# Patient Record
Sex: Male | Born: 1995 | Hispanic: Yes | Smoking: Former smoker
Health system: Southern US, Community
[De-identification: ages and names within clinical notes are randomized; demographics above are authoritative.]

## PROBLEM LIST (undated history)

## (undated) DIAGNOSIS — C6290 Malignant neoplasm of unspecified testis, unspecified whether descended or undescended: Secondary | ICD-10-CM

## (undated) HISTORY — PX: STOMACH SURGERY: SHX791

## (undated) HISTORY — DX: Malignant neoplasm of unspecified testis, unspecified whether descended or undescended: C62.90

---

## 2018-07-09 ENCOUNTER — Other Ambulatory Visit: Payer: Self-pay

## 2018-07-09 ENCOUNTER — Emergency Department: Payer: Self-pay

## 2018-07-09 DIAGNOSIS — N509 Disorder of male genital organs, unspecified: Secondary | ICD-10-CM | POA: Insufficient documentation

## 2018-07-09 NOTE — ED Triage Notes (Addendum)
Pt in with co testicular pain and states "won't go down". Pt states it is testicle not penis.States does have some pain when he moves, denies any dysuria. States hx of the same in the past and got an injection in his buttocks. Pt unable to tell me dx or medication given.

## 2018-07-09 NOTE — ED Notes (Signed)
Left testicle noted to be large and very firm on palpation.

## 2018-07-10 ENCOUNTER — Emergency Department
Admission: EM | Admit: 2018-07-10 | Discharge: 2018-07-10 | Disposition: A | Discharge status: Home / Self Care | Payer: Self-pay | Attending: Emergency Medicine | Admitting: Emergency Medicine

## 2018-07-10 ENCOUNTER — Other Ambulatory Visit: Payer: Self-pay

## 2018-07-10 ENCOUNTER — Ambulatory Visit: Payer: Self-pay | Admitting: Urology

## 2018-07-10 ENCOUNTER — Encounter
Admission: RE | Admit: 2018-07-10 | Discharge: 2018-07-10 | Disposition: A | Discharge status: Home / Self Care | Payer: Self-pay | Source: Ambulatory Visit | Attending: Urology | Admitting: Urology

## 2018-07-10 ENCOUNTER — Ambulatory Visit (INDEPENDENT_AMBULATORY_CARE_PROVIDER_SITE_OTHER): Payer: Self-pay | Admitting: Urology

## 2018-07-10 ENCOUNTER — Encounter: Payer: Self-pay | Admitting: Urology

## 2018-07-10 VITALS — BP 130/87 | HR 81 | Ht 63.0 in | Wt 144.3 lb

## 2018-07-10 DIAGNOSIS — N50819 Testicular pain, unspecified: Secondary | ICD-10-CM

## 2018-07-10 DIAGNOSIS — N5089 Other specified disorders of the male genital organs: Secondary | ICD-10-CM

## 2018-07-10 DIAGNOSIS — F1410 Cocaine abuse, uncomplicated: Secondary | ICD-10-CM

## 2018-07-10 DIAGNOSIS — F1910 Other psychoactive substance abuse, uncomplicated: Secondary | ICD-10-CM

## 2018-07-10 LAB — COMPREHENSIVE METABOLIC PANEL
ALT: 85 U/L — ABNORMAL HIGH (ref 0–44)
AST: 35 U/L (ref 15–41)
Albumin: 4.2 g/dL (ref 3.5–5.0)
Alkaline Phosphatase: 91 U/L (ref 38–126)
Anion gap: 9 (ref 5–15)
BUN: 13 mg/dL (ref 6–20)
CO2: 25 mmol/L (ref 22–32)
Calcium: 9 mg/dL (ref 8.9–10.3)
Chloride: 103 mmol/L (ref 98–111)
Creatinine, Ser: 0.62 mg/dL (ref 0.61–1.24)
GFR calc Af Amer: >60 mL/min (ref >60)
GFR calc non Af Amer: >60 mL/min (ref >60)
Glucose, Bld: 110 mg/dL — ABNORMAL HIGH (ref 70–99)
Potassium: 3.5 mmol/L (ref 3.5–5.1)
Sodium: 137 mmol/L (ref 135–145)
Total Bilirubin: 0.7 mg/dL (ref 0.3–1.2)
Total Protein: 8.1 g/dL (ref 6.5–8.1)

## 2018-07-10 LAB — URINE DRUG SCREEN, QUALITATIVE (ARMC ONLY)
Amphetamines, Ur Screen: NOT DETECTED
Barbiturates, Ur Screen: NOT DETECTED
Benzodiazepine, Ur Scrn: NOT DETECTED
Cannabinoid 50 Ng, Ur ~~LOC~~: NOT DETECTED
Cocaine Metabolite,Ur ~~LOC~~: NOT DETECTED
MDMA (Ecstasy)Ur Screen: NOT DETECTED
Methadone Scn, Ur: NOT DETECTED
OPIATE, UR SCREEN: NOT DETECTED
Phencyclidine (PCP) Ur S: NOT DETECTED
Tricyclic, Ur Screen: NOT DETECTED

## 2018-07-10 LAB — LACTATE DEHYDROGENASE: LDH: 126 U/L (ref 98–192)

## 2018-07-10 LAB — CBC
HCT: 45.8 % (ref 39.0–52.0)
Hemoglobin: 15.4 g/dL (ref 13.0–17.0)
MCH: 29.8 pg (ref 26.0–34.0)
MCHC: 33.6 g/dL (ref 30.0–36.0)
MCV: 88.6 fL (ref 80.0–100.0)
NRBC: 0 % (ref 0.0–0.2)
PLATELETS: 469 10*3/uL — AB (ref 150–400)
RBC: 5.17 MIL/uL (ref 4.22–5.81)
RDW: 12.3 % (ref 11.5–15.5)
WBC: 15 10*3/uL — ABNORMAL HIGH (ref 4.0–10.5)

## 2018-07-10 LAB — URINALYSIS, COMPLETE
Bilirubin, UA: NEGATIVE
Glucose, UA: NEGATIVE
Ketones, UA: NEGATIVE
LEUKOCYTES UA: NEGATIVE
Nitrite, UA: NEGATIVE
Protein, UA: NEGATIVE
RBC, UA: NEGATIVE
Specific Gravity, UA: 1.02 (ref 1.005–1.030)
Urobilinogen, Ur: 0.2 mg/dL (ref 0.2–1.0)
pH, UA: 7 (ref 5.0–7.5)

## 2018-07-10 LAB — MICROSCOPIC EXAMINATION
Bacteria, UA: NONE SEEN
Epithelial Cells (non renal): NONE SEEN /hpf (ref 0–10)
RBC, UA: NONE SEEN /hpf (ref 0–2)
WBC UA: NONE SEEN /HPF (ref 0–5)

## 2018-07-10 LAB — HCG, QUANTITATIVE, PREGNANCY: hCG, Beta Chain, Quant, S: 79 m[IU]/mL — ABNORMAL HIGH (ref <5)

## 2018-07-10 MED ORDER — OXYCODONE-ACETAMINOPHEN 5-325 MG PO TABS
1.0000 | ORAL_TABLET | Freq: Once | ORAL | Status: AC
  Administered 2018-07-10: 1 via ORAL
  Filled 2018-07-10: qty 1

## 2018-07-10 MED ORDER — OXYCODONE-ACETAMINOPHEN 5-325 MG PO TABS: 1.0000 | ORAL_TABLET | ORAL | 0 refills | Status: AC | PRN

## 2018-07-10 NOTE — H&P (View-Only) (Signed)
07/10/2018  10:16 AM   [place] Apr 19, 1996 [id number]  Referring provider: No referring provider defined for this encounter.  Chief Complaint  Patient presents with  . Testicle Pain    ED fu   HPI: Andre Valdez is a 23 y.o.[place] male that presents today as an ED follow-up for testicular pain with interpreter, Ronnald Collum.   Patient presented to the emergency department late last night (07/09/2018) with complaints of bilateral burning testicular pain and progressive left testicular swelling over the past month.   Scrotal US w/doppler with diffusely enlarged and heterogeneous appearance of the left testis suspicious for left testicular mass, surgical consultation recommended. Right testis with normal appearance, small hydrocele.  HCG from the ED was elevated.  WBC count 15.0.  Platelets 469.    He reports that at symptom onset, about a month ago, he noted swelling and persistent testicile burning pain on the left side, He denied any recent testicular injury. He has not been sexually active for the last 2-3 months. Denies frequency, urgency, dysuria, gross hematuria.  Patient denies any allergies, blood disorders, past surgical history and/or medical conditions. Patient is not on any medications and has no personal history of allergies/complications with anesthesia. He reports he has no known familial allergies/complications with anesthesia  He reports that he has not noted any lymphadenitis on his body. Although, he reports left abdominal pain starting yesterday.  No familial testicular cancer history. He has a child in [place].  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    PMH: No past medical history on file.  Surgical History: NONE  Home Medications:  Allergies as of 07/10/2018   No Known Allergies     Medication List       Accurate as of July 10, 2018 10:16 AM. Always use your most recent  med list.        oxyCODONE-acetaminophen 5-325 MG tablet Commonly known as:  PERCOCET Take 1 tablet by mouth every 4 (four) hours as needed.      Allergies: No Known Allergies  Family History: No family history on file.  Social History:  has no history on file for tobacco, alcohol, and drug.  ROS: UROLOGY Frequent Urination?: No Hard to postpone urination?: No Burning/pain with urination?: Yes Get up at night to urinate?: No Leakage of urine?: No Urine stream starts and stops?: No Trouble starting stream?: No Do you have to strain to urinate?: No Blood in urine?: No Urinary tract infection?: No Sexually transmitted disease?: No Injury to kidneys or bladder?: No Painful intercourse?: No Weak stream?: No Erection problems?: No Penile pain?: No  Gastrointestinal Nausea?: No Vomiting?: No Indigestion/heartburn?: No Diarrhea?: No Constipation?: No  Constitutional Fever: No Night sweats?: No Weight loss?: No Fatigue?: No  Skin Skin rash/lesions?: No Itching?: No  Eyes Blurred vision?: No Double vision?: No  Ears/Nose/Throat Sore throat?: No Sinus problems?: No  Hematologic/Lymphatic Swollen glands?: No Easy bruising?: No  Cardiovascular Leg swelling?: No Chest pain?: No  Respiratory Cough?: No Shortness of breath?: No  Endocrine Excessive thirst?: No  Musculoskeletal Back pain?: No Joint pain?: No  Neurological Headaches?: No Dizziness?: No  Psychologic Depression?: No Anxiety?: No  Physical Exam: BP 130/87 (BP Location: Left Arm, Patient Position: Sitting)   Pulse 81   Ht 5\' 3"  (1.6 m) Comment: per pt  Wt 144 lb 4.8 oz (65.5 kg)   BMI 25.56 kg/m   Constitutional:  Alert  and oriented, No acute distress. Respiratory: Normal respiratory effort, no increased work of breathing. Head: Normocephalic and Atraumatic. HEENT: Fertile AT, moist mucus membranes.  Trachea midline, no masses. Cardiovascular: No clubbing, cyanosis, or  edema. GI: Abdomen is soft, non tender, non distended, no abdominal masses. Liver and spleen not palpable.  No hernias appreciated.  Stool sample for occult testing is not indicated.   GU: No CVA tenderness. No bladder fullness or masses.  Patient with uncircumcised phallus. Foreskin easily retracted.  Urethral meatus is patent.  No penile discharge. No penile lesions or rashes. Testicles are located scrotally bilaterally. Right scrotum is normal. Firm Left Scrotum with mass, no erythema, lesions, cysts, rashes and/or edema.   Lymph nodes: No cervical, supraclavicular, axillary nodes normal or inguinal lymphadenopathy. Skin: No rashes, bruises or suspicious lesions. Neurologic: Grossly intact, no focal deficits, moving all 4 extremities. Psychiatric: Normal mood and affect.  Laboratory Data: Lab Results  Component Value Date   WBC 15.0 (H) 07/10/2018   HGB 15.4 07/10/2018   HCT 45.8 07/10/2018   MCV 88.6 07/10/2018   PLT 469 (H) 07/10/2018   Urinalysis Microscopic Exam  Ref Range and Units  WBC None 0-5/hpf  RBC None 0-2/hpf  Epithelial Cells None 0-10/hpf (non-renal)  Renal Epithelial Cells None /hpf  Casts None None seen/lpf  Cast Type None   Crystals Presp None seen/lpf  Crystal Type amorphous sediment   Mucus Threads None Not established/lp  Bacteria None None to few seen  Yeast None None seen  Trichomonas None None seen  Dipstick Results    NIT Negative Negative  LEU Negative Negative    Pertinent Imaging: US Scrotum w/ doppler  Result Date: 07/10/2018  CLINICAL DATA:  Bilateral testicular pain for 1 month. Left testicular swelling for 1 week.   EXAM: SCROTAL ULTRASOUND DOPPLER ULTRASOUND OF THE TESTICLES   TECHNIQUE: Complete ultrasound examination of the testicles, epididymis, and other scrotal structures was performed. Color and spectral Doppler ultrasound were also utilized to evaluate blood flow to the testicles.   COMPARISON:  None.   FINDINGS:   Right  testicle Measurements: 4.4 x 2.1 x 2.8 cm. No mass or microlithiasis visualized.   Left testicle Measurements: 8.7 x 5.7 x 7.1 cm. The left testis is diffusely enlarged with heterogeneous appearance. Increased flow throughout the left testis on color flow Doppler imaging. Changes are suspicious for left testicular mass. Testicular rupture with hematoma and infection would be a less likely consideration.   Right epididymis:  Normal in size and appearance.   Left epididymis:  Not visualized.   Hydrocele:  A small right hydrocele is present. Varicocele:  None visualized. Pulsed Doppler interrogation of both testes demonstrates normal low resistance arterial and venous waveforms bilaterally. Abnormal vascularity is demonstrated within the left testis.   IMPRESSION:  1. Diffusely enlarged and heterogeneous appearance of the left testis suspicious for left testicular mass. Surgical consultation is recommended.  2. Right testis has normal appearance.  Small right hydrocele.   Electronically Signed    By: Lucienne Capers M.D.    On: 07/10/2018 01:03   I have personally reviewed all the images today with the patient and interpreter and appreciate the left testicular mass.    Assessment & Plan:    1. Testicular Mass  - I reviewed the Scrotal US images with the patient and translator and addressed all patient questions and concerns.  - We discussed the patients prognosis given imaging and symptom presentation as well as reasonable next steps  -  I explained my recommendation for radical left orchiectomy as well as the surgical process, expectations and possible e outcomes. I explained that following surgery, we will obtain pathology of the testicular mass and pending the pathology, he may require an extended lymphadenectomy, chemotherapy and/or radiation.   - I explained that surgical complications could include infection at the surgical site, scrotal hematoma, and discharge at the surgical site.  - I  further explained that most testicular cancers have favorable results.   - Patient has a child in [place]. We discussed possible fertility complications and discussed preventative measures that can be taken to ensure his family planning options remain unchanged by his surgery, I.e. semen storage. Although, it is less likely he will experience inconveniences given he will retain his right testicle.  - Patient denies any allergies, medication usage, familial or personal anesthetic problems, blood disorders, past surgical history and/or medical conditions.  - I explained the option for a testicular prosthesis following his orchiectomy, as well as possible complications and side effects. I expressed that in my experience, men with testicular prosthesis have experienced prolonged testicular pain/discomfort.  - Patient presented concerns for when he will be able to return to work following his surgery. I advised that he should plan to be out of work for at least 2 weeks  - We discussed that following the surgery it is likely that he will experience pain, bruising, hematoma, mood changes, fatigue and hot flashes.  - Patient is agreeable to plan and would like to move forward with scheduling his surgery.  - CMET  -CT and chest x-ray ordered   2. Left Abdominal Pain  - Likely referred pain from scrotal imbalance due to enlargement in left testicle.  3. History of drug use  - drug screen   Return for radical left orchiectomy.  Zara Council, PA-C [facility] [address] [place], [place] [address] [phone]  30-minute discussion with patient regarding abnormal imaging/lab results, diagnosis possibilities, treatment options, risks and benefits. The patient had few questions and concerns regarding these options and the long-term effects.  I, Temidayo Atanda-Ogunleye , am acting as a scribe for [facility], PA-C  I have reviewed the above documentation  for accuracy and completeness, and I agree with the above.    Zara Council, PA-C

## 2018-07-10 NOTE — Patient Instructions (Signed)
Your procedure is scheduled on: 07/14/17 Mon Su procedimiento est programado para: Report to 2nd floor [place]: To find out your arrival time please call [phone] between 1PM - 3PM onFri 07/11/18 Para saber su hora de llegada por favor llame al ([place]:   Remember: Instructions that are not followed completely may result in serious medical risk, up to and including death,  or upon the discretion of your surgeon and anesthesiologist your surgery may need to be rescheduled.  Recuerde: Las instrucciones que no se siguen completamente Heritage manager en un riesgo de salud grave, incluyendo hasta  la [place] o a discrecin de su cirujano y Environmental health practitioner, su ciruga se puede posponer.   __X_ 1.Do not eat food after midnight the night before your procedure. No    gum chewing or hard candies. You may drink clear liquids up to 2 hours     before you are scheduled to arrive for your surgery- DO not drink clear     Liquids within 2 hours of the start of your surgery.     Clear Liquids include:    water, apple juice without pulp, clear carbohydrate drink such as    Clearfast of Gartorade, Black Coffee or Tea (Do not add anything to coffee or tea).      No coma nada despus de la medianoche de la noche anterior a su    procedimiento. No coma chicles ni caramelos duros. Puede tomar    lquidos claros hasta 2 horas antes de su hora programada de llegada al     hospital para su procedimiento. No tome lquidos claros durante el     transcurso de las 2 horas de su llegada programada al hospital para su     procedimiento, ya que esto puede llevar a que su procedimiento se    retrase o tenga que volver a Health and safety inspector.  Los lquidos claros incluyen:          - Agua o jugo de [place] sin pulpa          - Bebidas claras con carbohidratos como ClearFast o Gatorade          - Caf negro o t claro (sin leche, sin cremas, no agregue nada al caf ni al  t)  No tome nada que no est en esta lista.  Los pacientes con diabetes tipo 1 y tipo 2 solo deben Agricultural engineer.  Llame a la clnica de [facility] o a la unidad de Same Day Surgery si  tiene alguna pregunta sobre estas instrucciones.              _X__ 2.Do Not Smoke or use e-cigarettes For 24 Hours Prior to Your Surgery.    Do not use any chewable tobacco products for at least 6   hours prior to surgery.    No fume ni use cigarrillos electrnicos durante las 24 horas previas    a su [place].  No use ningn producto de tabaco masticable durante   al menos 6 horas antes de la [place].     __X_ 3. No alcohol for 24 hours before or after surgery.    No tome alcohol durante las 24 horas antes ni despus de la [place].   ____4. Bring all medications with you on the day of surgery if instructed.    Lleve todos los medicamentos con usted el da de su ciruga si se le    ha indicado as.  ____ 5. Notify your doctor if there is any change in your medical condition (cold,fever, infections).    Informe a su mdico si hay algn cambio en su condicin mdica  (resfriado, fiebre, infecciones).   Do not wear jewelry, make-up, hairpins, clips or nail polish.  No use joyas, maquillajes, pinzas/ganchos para el cabello ni esmalte de uas.  Do not wear lotions, powders, or perfumes. You may wear deodorant.  No use lociones, polvos o perfumes.  Puede usar desodorante.    Do not shave 48 hours prior to surgery. Men may shave face and neck.  No se afeite 48 horas antes de la [place].  Los hombres pueden [employer] cara  y el cuello.   Do not bring valuables to the hospital.   No lleve objetos [place] is not responsible for any belongings or valuables.  [place] no se hace responsable de ningn tipo de pertenencias u objetos de Geographical information systems officer.               Contacts, dentures or bridgework may not be worn into surgery.  Los lentes de [place], las dentaduras postizas o puentes no se  pueden usar en la [place].   Leave your suitcase in the car. After surgery it may be brought to your room.  Deje su maleta en el auto.  Despus de la ciruga podr traerla a su habitacin.   For patients admitted to the hospital, discharge time is determined by your  treatment team.  Para los pacientes que sean ingresados al hospital, el tiempo en el cual se le  dar de alta es determinado por su equipo de [place].   Patients discharged the day of surgery will not be allowed to drive home. A los pacientes que se les da de alta el mismo da de la ciruga no se les permitir conducir a Holiday representative.   Please read over the following fact sheets that you were given: Por favor [place] informacin que le dieron:    ____ Take these medicines the morning of surgery with A SIP OF WATER:          [employer] medicinas la maana de la ciruga con UN SORBO DE AGUA:  1.None  2.   3.   4.       5.  6.  ____ Fleet Enema (as directed)          Enema de Fleet (segn lo indicado)    __x__ Use CHG Soap as directed          Utilice el jabn de CHG segn lo indicado  ____ Use inhalers on the day of surgery          Use los inhaladores el da de la ciruga  ____ Stop metformin 2 days prior to surgery          Deje de tomar el metformin 2 das antes de la ciruga    ____ Take 1/2 of usual insulin dose the night before surgery and none on the morning of surgery           Tome la mitad de la dosis habitual de insulina la noche antes de la [place] y no tome nada en la maana de la             ciruga  ____ Stop Coumadin/Plavix/aspirin on           Deje de tomar el Coumadin/Plavix/aspirina el da:  ____ Stop Anti-inflammatories  on           Deje de tomar [employer]:   ____ Stop supplements until after surgery            Deje de tomar suplementos hasta despus de la ciruga  ____ Bring C-Pap to the hospital          [address] al hospital

## 2018-07-10 NOTE — ED Notes (Addendum)
ED Provider at bedside. 

## 2018-07-10 NOTE — Patient Instructions (Signed)
Orquiectoma Orchiectomy  La orquiectoma es la extirpacin de uno o ambos testculos. Generalmente, se realiza para tratar el cncer de prstata o de testculos. Se puede realizar en hombres con cncer de mama o para prevenir el cncer en hombres cuyos testculos no se desarrollaron normalmente. Una orquiectoma tambin puede ser necesaria cuando no se puede reparar una lesin en un testculo. Los testculos pueden reemplazarse con testculos artificiales (prtesis). Informe al mdico acerca de lo siguiente:  Cualquier alergia que tenga.  Todos los [employer], incluidos vitaminas, hierbas, gotas oftlmicas, cremas y medicamentos de venta libre.  Cualquier problema que usted o sus familiares hayan tenido con anestsicos.  Cualquier enfermedad de la sangre que tenga.  Cirugas a las que se someti.  Cualquier enfermedad que tenga. Cules son los riesgos?  Infeccin en el lugar de la [place].  Hemorragia en el interior del saco que contiene los testculos (escroto). Esto se llama hematoma escrotal.  Secrecin en el lugar de la ciruga. Qu ocurre antes del procedimiento? Hidratacin Siga las indicaciones del mdico acerca de la hidratacin, las cuales pueden incluir lo siguiente:  Hasta 2 horas antes del procedimiento, puede beber lquidos transparentes, como agua, jugos frutales transparentes, caf negro y t solo. Restricciones en las comidas y bebidas Siga las indicaciones del mdico respecto de las comidas y bebidas, las cuales pueden incluir lo siguiente:  Ocho horas antes del procedimiento, deje de ingerir comidas o alimentos pesados, por ejemplo, carne, alimentos fritos o alimentos grasos.  Seis horas antes del procedimiento, deje de ingerir comidas o alimentos livianos, como tostadas o cereales.  Seis horas antes del procedimiento, deje de beber [place] o bebidas que [employer].  Dos horas antes del procedimiento, deje de beber lquidos  transparentes. Instrucciones generales  Consulte al mdico si debe hacer o no lo siguiente: ? Cambiar o suspender los medicamentos que toma habitualmente. Esto es muy importante si toma medicamentos para la diabetes o anticoagulantes. ? Tomar medicamentos, como aspirina e ibuprofeno. Estos medicamentos pueden tener un efecto anticoagulante en la [place]. No tome estos medicamentos antes del procedimiento si el mdico le indica que no lo haga.  Pueden recetarle un antibitico por va oral. Consulte al [employer] sobre tomar [employer] con un sorbo de agua la maana del procedimiento.  Le indicarn que se higienice la zona genital con un jabn antisptico la maana del procedimiento.  Pdale a alguien que lo lleve a su casa desde el hospital o la clnica. Qu ocurre durante el procedimiento?  Para disminuir el riesgo de contraer una infeccin: ? El equipo mdico se lavar o se Transport planner. ? Le lavarn la piel con jabn. ? Pueden rasurarle la zona [place].  Le colocarn un tubo (catter) intravenoso en una de las venas. Le administrarn uno o ms de los siguientes medicamentos: ? Un medicamento para ayudarlo a relajarse (sedante). ? Un medicamento para adormecer la zona (anestesia local). ? Un medicamento que lo har dormir (anestesia general).  Este procedimiento puede implicar la extraccin de uno o ambos testculos. Los pasos para el procedimiento dependern del motivo del procedimiento. ? Si su procedimiento es para el tratamiento del cncer de prstata:  Le harn un corte (incisin) en el escroto.  El testculo se extirpar a travs de la incisin hecha en el escroto.  Se puede insertar una prtesis llena de solucin salina para llenar el espacio en el escroto donde se extirp el testculo. ? Si su procedimiento es para el tratamiento del cncer de testculo:  Le harn un corte (incisin) en la ingle.  El testculo y el cordn espermtico se extirparn a travs  de la incisin hecha en la ingle.  Se puede insertar una prtesis llena de solucin salina para llenar el espacio en el escroto donde se extirp el testculo. ? Luego, la incisin se cerrar con puntos (suturas), tiras Raelyn Number o goma para cerrar la piel. ? Se le colocar una venda (vendaje) estril en el lugar de la incisin. Este procedimiento puede variar segn el mdico y el hospital. Sander Nephew ocurre despus del procedimiento?  Le controlarn la presin arterial, la frecuencia cardaca, la frecuencia respiratoria y Retail buyer de oxgeno en la sangre hasta que haya desaparecido el efecto de los medicamentos administrados.  Una vez que despierte, se encuentre estabilizado y pueda ingerir lquidos correctamente, si no ocurre un imprevisto, podr volver a su casa.  Puede tener que usar un soporte escrotal. Si el soporte escrotal Dance movement psychotherapist de la incisin, podr Nurse, learning disability.  Tendr un vendaje estril. Puede sacarse el vendaje, especialmente por la noche. El aire ayuda a que se forme una costra, lo que eliminar la necesidad de Psychologist, forensic.  No conduzca durante 24 horas si le administraron un sedante. Resumen  La orquiectoma es un procedimiento quirrgico que se realiza para extirpar uno o ambos testculos.  La orquiectoma generalmente se realiza para tratar el cncer de prstata o de testculos.  Se puede insertar una prtesis llena de solucin salina para llenar el espacio en el escroto donde se extirp el testculo. Esta informacin no tiene Marine scientist el consejo del mdico. Asegrese de hacerle al mdico cualquier pregunta que tenga. Document Released: 01/21/2013 Document Revised: 02/26/2017 Document Reviewed: 02/26/2017 Elsevier Interactive Patient Education  2019 [employer].

## 2018-07-10 NOTE — ED Provider Notes (Signed)
[facility] Emergency Department Provider Note __________________   First MD Initiated Contact with Patient 07/10/18 0103     (approximate)  I have reviewed the triage vital signs and the nursing notes.   HISTORY  Chief Complaint Testicle Pain    HPI Andre Valdez is a 23 y.o. male Libby Maw to the emergency department with a 1 month history of progressive swelling and pain to the left testicle.  Patient denies any dysuria.  Patient denies any hematuria no nausea vomiting diarrhea constipation.  Patient denies any weight loss.  Past medical history None There are no active problems to display for this patient.    Prior to Admission medications   Not on File    Allergies No known drug allergies with No family history on file.  Social History Social History   Tobacco Use  . Smoking status: Not on file  Substance Use Topics  . Alcohol use: Not on file  . Drug use: Not on file    Review of Systems Constitutional: No fever/chills Eyes: No visual changes. ENT: No sore throat. Cardiovascular: Denies chest pain. Respiratory: Denies shortness of breath. Gastrointestinal: No abdominal pain.  No nausea, no vomiting.  No diarrhea.  No constipation. Genitourinary: Negative for dysuria.  Positive for left testicular pain and swelling Musculoskeletal: Negative for neck pain.  Negative for back pain. Integumentary: Negative for rash. Neurological: Negative for headaches, focal weakness or numbness.   ____________________________________________   PHYSICAL EXAM:  VITAL SIGNS: ED Triage Vitals  Enc Vitals Group     BP 07/09/18 2316 (!) 141/77     Pulse Rate 07/09/18 2316 85     Resp 07/09/18 2316 16     Temp 07/09/18 2316 99.3 F (37.4 C)     Temp Source 07/09/18 2316 Oral     SpO2 07/09/18 2316 99 %     Weight --      Height --      Head Circumference --      Peak Flow --      Pain Score 07/09/18 2320 8     Pain Loc --    Pain Edu? --      Excl. in [place]? --     Constitutional: Alert and oriented. Well appearing and in no acute distress. Eyes: Conjunctivae are normal.  Mouth/Throat: Mucous membranes are moist.  Oropharynx non-erythematous. Neck: No stridor.   Cardiovascular: Normal rate, regular rhythm. Good peripheral circulation. Grossly normal heart sounds. Respiratory: Normal respiratory effort.  No retractions. Lungs CTAB. Gastrointestinal: Soft and nontender. No distention.  Genitourinary: Markedly swollen and indurated left testicle very tender to touch. Musculoskeletal: No lower extremity tenderness nor edema. No gross deformities of extremities. Neurologic:  Normal speech and language. No gross focal neurologic deficits are appreciated.  Skin:  Skin is warm, dry and intact. No rash noted.   ____________________________________________   LABS (all labs ordered are listed, but only abnormal results are displayed)  Labs Reviewed  CBC - Abnormal; Notable for the following components:      Result Value   WBC 15.0 (*)    Platelets 469 (*)    All other components within normal limits  LACTATE DEHYDROGENASE  AFP TUMOR MARKER  HCG, QUANTITATIVE, PREGNANCY    RADIOLOGY I,  N Brantlee Hinde, personally viewed and evaluated these images (plain radiographs) as part of my medical decision making, as well as reviewing the written report by the radiologist.  ED MD interpretation: Diffusely enlarged and heterogeneous appearance of the  left testis suspicious for left testicular mass per radiologist.  Official radiology report(s): US Scrotum W/doppler  Result Date: 07/10/2018 CLINICAL DATA:  Bilateral testicular pain for 1 month. Left testicular swelling for 1 week. EXAM: SCROTAL ULTRASOUND DOPPLER ULTRASOUND OF THE TESTICLES TECHNIQUE: Complete ultrasound examination of the testicles, epididymis, and other scrotal structures was performed. Color and spectral Doppler ultrasound were also utilized to evaluate  blood flow to the testicles. COMPARISON:  None. FINDINGS: Right testicle Measurements: 4.4 x 2.1 x 2.8 cm. No mass or microlithiasis visualized. Left testicle Measurements: 8.7 x 5.7 x 7.1 cm. The left testis is diffusely enlarged with heterogeneous appearance. Increased flow throughout the left testis on color flow Doppler imaging. Changes are suspicious for left testicular mass. Testicular rupture with hematoma and infection would be a less likely consideration. Right epididymis:  Normal in size and appearance. Left epididymis:  Not visualized. Hydrocele:  A small right hydrocele is present. Varicocele:  None visualized. Pulsed Doppler interrogation of both testes demonstrates normal low resistance arterial and venous waveforms bilaterally. Abnormal vascularity is demonstrated within the left testis. IMPRESSION: 1. Diffusely enlarged and heterogeneous appearance of the left testis suspicious for left testicular mass. Surgical consultation is recommended. 2. Right testis has normal appearance.  Small right hydrocele. Electronically Signed   By: Lucienne Capers M.D.   On: 07/10/2018 01:03    Procedures   ____________________________________________   INITIAL IMPRESSION / ASSESSMENT AND PLAN / ED COURSE  As part of my medical decision making, I reviewed the following data within the electronic MEDICAL RECORD NUMBER   23 year old male presenting with above-stated history and physical exam concerning for testicular cancer versus epididymitis or orchitis.  Ultrasound performed consistent with left testicular mass.  Patient discussed with Dr. Erlene Quan urology appropriate tumor markers obtained.  Patient will follow-up with Dr. Erlene Quan for outpatient evaluation and management. ____________________________________________  FINAL CLINICAL IMPRESSION(S) / ED DIAGNOSES  Final diagnoses:  Testicular mass     MEDICATIONS GIVEN DURING THIS VISIT:  Medications  oxyCODONE-acetaminophen (PERCOCET/ROXICET) 5-325  MG per tablet 1 tablet (1 tablet Oral Given 07/10/18 0136)     ED Discharge Orders    None       Note:  This document was prepared using Dragon voice recognition software and may include unintentional dictation errors.   Gregor Hams, MD 07/10/18 [phone]

## 2018-07-10 NOTE — Progress Notes (Addendum)
07/10/2018  10:16 AM   [place] 1995-09-01 [id number]  Referring provider: No referring provider defined for this encounter.  Chief Complaint  Patient presents with  . Testicle Pain    ED fu   HPI: Andre Valdez is a 23 y.o.[place] male that presents today as an ED follow-up for testicular pain with interpreter, Ronnald Collum.   Patient presented to the emergency department late last night (07/09/2018) with complaints of bilateral burning testicular pain and progressive left testicular swelling over the past month.   Scrotal US w/doppler with diffusely enlarged and heterogeneous appearance of the left testis suspicious for left testicular mass, surgical consultation recommended. Right testis with normal appearance, small hydrocele.  HCG from the ED was elevated.  WBC count 15.0.  Platelets 469.    He reports that at symptom onset, about a month ago, he noted swelling and persistent testicile burning pain on the left side, He denied any recent testicular injury. He has not been sexually active for the last 2-3 months. Denies frequency, urgency, dysuria, gross hematuria.  Patient denies any allergies, blood disorders, past surgical history and/or medical conditions. Patient is not on any medications and has no personal history of allergies/complications with anesthesia. He reports he has no known familial allergies/complications with anesthesia  He reports that he has not noted any lymphadenitis on his body. Although, he reports left abdominal pain starting yesterday.  No familial testicular cancer history. He has a child in [place].  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    PMH: No past medical history on file.  Surgical History: NONE  Home Medications:  Allergies as of 07/10/2018   No Known Allergies     Medication List       Accurate as of July 10, 2018 10:16 AM. Always use your most recent  med list.        oxyCODONE-acetaminophen 5-325 MG tablet Commonly known as:  PERCOCET Take 1 tablet by mouth every 4 (four) hours as needed.      Allergies: No Known Allergies  Family History: No family history on file.  Social History:  has no history on file for tobacco, alcohol, and drug.  ROS: UROLOGY Frequent Urination?: No Hard to postpone urination?: No Burning/pain with urination?: Yes Get up at night to urinate?: No Leakage of urine?: No Urine stream starts and stops?: No Trouble starting stream?: No Do you have to strain to urinate?: No Blood in urine?: No Urinary tract infection?: No Sexually transmitted disease?: No Injury to kidneys or bladder?: No Painful intercourse?: No Weak stream?: No Erection problems?: No Penile pain?: No  Gastrointestinal Nausea?: No Vomiting?: No Indigestion/heartburn?: No Diarrhea?: No Constipation?: No  Constitutional Fever: No Night sweats?: No Weight loss?: No Fatigue?: No  Skin Skin rash/lesions?: No Itching?: No  Eyes Blurred vision?: No Double vision?: No  Ears/Nose/Throat Sore throat?: No Sinus problems?: No  Hematologic/Lymphatic Swollen glands?: No Easy bruising?: No  Cardiovascular Leg swelling?: No Chest pain?: No  Respiratory Cough?: No Shortness of breath?: No  Endocrine Excessive thirst?: No  Musculoskeletal Back pain?: No Joint pain?: No  Neurological Headaches?: No Dizziness?: No  Psychologic Depression?: No Anxiety?: No  Physical Exam: BP 130/87 (BP Location: Left Arm, Patient Position: Sitting)   Pulse 81   Ht 5\' 3"  (1.6 m) Comment: per pt  Wt 144 lb 4.8 oz (65.5 kg)   BMI 25.56 kg/m   Constitutional:  Alert  and oriented, No acute distress. Respiratory: Normal respiratory effort, no increased work of breathing. Head: Normocephalic and Atraumatic. HEENT: [place] AT, moist mucus membranes.  Trachea midline, no masses. Cardiovascular: No clubbing, cyanosis, or  edema. GI: Abdomen is soft, non tender, non distended, no abdominal masses. Liver and spleen not palpable.  No hernias appreciated.  Stool sample for occult testing is not indicated.   GU: No CVA tenderness. No bladder fullness or masses.  Patient with uncircumcised phallus. Foreskin easily retracted.  Urethral meatus is patent.  No penile discharge. No penile lesions or rashes. Testicles are located scrotally bilaterally. Right scrotum is normal. Firm Left Scrotum with mass, no erythema, lesions, cysts, rashes and/or edema.   Lymph nodes: No cervical, supraclavicular, axillary nodes normal or inguinal lymphadenopathy. Skin: No rashes, bruises or suspicious lesions. Neurologic: Grossly intact, no focal deficits, moving all 4 extremities. Psychiatric: Normal mood and affect.  Laboratory Data: Lab Results  Component Value Date   WBC 15.0 (H) 07/10/2018   HGB 15.4 07/10/2018   HCT 45.8 07/10/2018   MCV 88.6 07/10/2018   PLT 469 (H) 07/10/2018   Urinalysis Microscopic Exam  Ref Range and Units  WBC None 0-5/hpf  RBC None 0-2/hpf  Epithelial Cells None 0-10/hpf (non-renal)  Renal Epithelial Cells None /hpf  Casts None None seen/lpf  Cast Type None   Crystals Presp None seen/lpf  Crystal Type amorphous sediment   Mucus Threads None Not established/lp  Bacteria None None to few seen  Yeast None None seen  Trichomonas None None seen  Dipstick Results    NIT Negative Negative  LEU Negative Negative    Pertinent Imaging: US Scrotum w/ doppler  Result Date: 07/10/2018  CLINICAL DATA:  Bilateral testicular pain for 1 month. Left testicular swelling for 1 week.   EXAM: SCROTAL ULTRASOUND DOPPLER ULTRASOUND OF THE TESTICLES   TECHNIQUE: Complete ultrasound examination of the testicles, epididymis, and other scrotal structures was performed. Color and spectral Doppler ultrasound were also utilized to evaluate blood flow to the testicles.   COMPARISON:  None.   FINDINGS:   Right  testicle Measurements: 4.4 x 2.1 x 2.8 cm. No mass or microlithiasis visualized.   Left testicle Measurements: 8.7 x 5.7 x 7.1 cm. The left testis is diffusely enlarged with heterogeneous appearance. Increased flow throughout the left testis on color flow Doppler imaging. Changes are suspicious for left testicular mass. Testicular rupture with hematoma and infection would be a less likely consideration.   Right epididymis:  Normal in size and appearance.   Left epididymis:  Not visualized.   Hydrocele:  A small right hydrocele is present. Varicocele:  None visualized. Pulsed Doppler interrogation of both testes demonstrates normal low resistance arterial and venous waveforms bilaterally. Abnormal vascularity is demonstrated within the left testis.   IMPRESSION:  1. Diffusely enlarged and heterogeneous appearance of the left testis suspicious for left testicular mass. Surgical consultation is recommended.  2. Right testis has normal appearance.  Small right hydrocele.   Electronically Signed    By: Lucienne Capers M.D.    On: 07/10/2018 01:03   I have personally reviewed all the images today with the patient and interpreter and appreciate the left testicular mass.    Assessment & Plan:    1. Testicular Mass  - I reviewed the Scrotal US images with the patient and translator and addressed all patient questions and concerns.  - We discussed the patients prognosis given imaging and symptom presentation as well as reasonable next steps  -  I explained my recommendation for radical left orchiectomy as well as the surgical process, expectations and possible e outcomes. I explained that following surgery, we will obtain pathology of the testicular mass and pending the pathology, he may require an extended lymphadenectomy, chemotherapy and/or radiation.   - I explained that surgical complications could include infection at the surgical site, scrotal hematoma, and discharge at the surgical site.  - I  further explained that most testicular cancers have favorable results.   - Patient has a child in [place]. We discussed possible fertility complications and discussed preventative measures that can be taken to ensure his family planning options remain unchanged by his surgery, I.e. semen storage. Although, it is less likely he will experience inconveniences given he will retain his right testicle.  - Patient denies any allergies, medication usage, familial or personal anesthetic problems, blood disorders, past surgical history and/or medical conditions.  - I explained the option for a testicular prosthesis following his orchiectomy, as well as possible complications and side effects. I expressed that in my experience, men with testicular prosthesis have experienced prolonged testicular pain/discomfort.  - Patient presented concerns for when he will be able to return to work following his surgery. I advised that he should plan to be out of work for at least 2 weeks  - We discussed that following the surgery it is likely that he will experience pain, bruising, hematoma, mood changes, fatigue and hot flashes.  - Patient is agreeable to plan and would like to move forward with scheduling his surgery.  - CMET  -CT and chest x-ray ordered   2. Left Abdominal Pain  - Likely referred pain from scrotal imbalance due to enlargement in left testicle.  3. History of drug use  - drug screen   Return for radical left orchiectomy.  Zara Council, PA-C [facility] [address], [place], [place] [address] [phone]  30-minute discussion with patient regarding abnormal imaging/lab results, diagnosis possibilities, treatment options, risks and benefits. The patient had few questions and concerns regarding these options and the long-term effects.  I, Temidayo Atanda-Ogunleye , am acting as a scribe for [facility], PA-C  I have reviewed the above documentation  for accuracy and completeness, and I agree with the above.    Zara Council, PA-C

## 2018-07-10 NOTE — Pre-Procedure Instructions (Signed)
Hands and feet reddish coloration, skin cool, radial pulses moderate, capillary refill > 3 sec., pedal pulses weak. Nail beds blanched.

## 2018-07-10 NOTE — Pre-Procedure Instructions (Signed)
EKG/ REQUEST FOR MEDICAL CLEARANCE CALLED AND FAXED TO [place] AT DR Cherrie Gauze. PT DOES NOT HAVE PCP. PER LEAH SHE WILL SPEAK TO DR Erlene Quan ABOUT POSSIBLY GETTING APPT WITH DR Rockey Situ

## 2018-07-11 ENCOUNTER — Telehealth: Payer: Self-pay

## 2018-07-11 LAB — COMPREHENSIVE METABOLIC PANEL
ALBUMIN: 4.8 g/dL (ref 4.1–5.2)
ALT: 81 IU/L — ABNORMAL HIGH (ref 0–44)
AST: 32 IU/L (ref 0–40)
Albumin/Globulin Ratio: 1.7 (ref 1.2–2.2)
Alkaline Phosphatase: 114 IU/L (ref 39–117)
BUN/Creatinine Ratio: 13 (ref 9–20)
BUN: 9 mg/dL (ref 6–20)
Bilirubin Total: 0.6 mg/dL (ref 0.0–1.2)
CO2: 23 mmol/L (ref 20–29)
Calcium: 9.7 mg/dL (ref 8.7–10.2)
Chloride: 97 mmol/L (ref 96–106)
Creatinine, Ser: 0.7 mg/dL — ABNORMAL LOW (ref 0.76–1.27)
GFR calc Af Amer: 156 mL/min/{1.73_m2} (ref >59)
GFR calc non Af Amer: 135 mL/min/{1.73_m2} (ref >59)
GLUCOSE: 96 mg/dL (ref 65–99)
Globulin, Total: 2.9 g/dL (ref 1.5–4.5)
Potassium: 3.9 mmol/L (ref 3.5–5.2)
Sodium: 137 mmol/L (ref 134–144)
Total Protein: 7.7 g/dL (ref 6.0–8.5)

## 2018-07-11 NOTE — Pre-Procedure Instructions (Signed)
CBC results sent to Dr. Erlene Quan for review.

## 2018-07-11 NOTE — Telephone Encounter (Signed)
error 

## 2018-07-13 LAB — AFP TUMOR MARKER: AFP, Serum, Tumor Marker: 1992 ng/mL — ABNORMAL HIGH (ref 0.0–8.3)

## 2018-07-13 MED ORDER — CEFAZOLIN SODIUM-DEXTROSE 2-4 GM/100ML-% IV SOLN
2.0000 g | Freq: Once | INTRAVENOUS | Status: AC
  Administered 2018-07-14: 2 g via INTRAVENOUS

## 2018-07-14 ENCOUNTER — Other Ambulatory Visit: Payer: Self-pay

## 2018-07-14 ENCOUNTER — Telehealth: Payer: Self-pay | Admitting: Emergency Medicine

## 2018-07-14 ENCOUNTER — Encounter: Payer: Self-pay | Admitting: *Deleted

## 2018-07-14 ENCOUNTER — Encounter
Admission: RE | Disposition: A | Discharge status: Home / Self Care | Payer: Self-pay | Source: Home / Self Care | Attending: Urology

## 2018-07-14 ENCOUNTER — Ambulatory Visit: Payer: Self-pay | Admitting: Anesthesiology

## 2018-07-14 ENCOUNTER — Ambulatory Visit
Admission: RE | Admit: 2018-07-14 | Discharge: 2018-07-14 | Disposition: A | Discharge status: Home / Self Care | Payer: Self-pay | Attending: Urology | Admitting: Urology

## 2018-07-14 DIAGNOSIS — C6292 Malignant neoplasm of left testis, unspecified whether descended or undescended: Secondary | ICD-10-CM | POA: Insufficient documentation

## 2018-07-14 DIAGNOSIS — N5089 Other specified disorders of the male genital organs: Secondary | ICD-10-CM

## 2018-07-14 DIAGNOSIS — D4012 Neoplasm of uncertain behavior of left testis: Secondary | ICD-10-CM

## 2018-07-14 DIAGNOSIS — Z87891 Personal history of nicotine dependence: Secondary | ICD-10-CM | POA: Insufficient documentation

## 2018-07-14 HISTORY — PX: ORCHIECTOMY: SHX2116

## 2018-07-14 LAB — URINE DRUG SCREEN, QUALITATIVE (ARMC ONLY)
Amphetamines, Ur Screen: NOT DETECTED
Barbiturates, Ur Screen: NOT DETECTED
Benzodiazepine, Ur Scrn: NOT DETECTED
Cannabinoid 50 Ng, Ur ~~LOC~~: NOT DETECTED
Cocaine Metabolite,Ur ~~LOC~~: NOT DETECTED
MDMA (Ecstasy)Ur Screen: NOT DETECTED
Methadone Scn, Ur: NOT DETECTED
Opiate, Ur Screen: NOT DETECTED
Phencyclidine (PCP) Ur S: NOT DETECTED
Tricyclic, Ur Screen: NOT DETECTED

## 2018-07-14 SURGERY — ORCHIECTOMY
Anesthesia: General | Site: Inguinal | Laterality: Left

## 2018-07-14 MED ORDER — FENTANYL CITRATE (PF) 100 MCG/2ML IJ SOLN
INTRAMUSCULAR | Status: AC
  Filled 2018-07-14: qty 2

## 2018-07-14 MED ORDER — GLYCOPYRROLATE 0.2 MG/ML IJ SOLN
INTRAMUSCULAR | Status: AC
  Filled 2018-07-14: qty 1

## 2018-07-14 MED ORDER — ACETAMINOPHEN 10 MG/ML IV SOLN
INTRAVENOUS | Status: DC | PRN
  Administered 2018-07-14: 1000 mg via INTRAVENOUS

## 2018-07-14 MED ORDER — GLYCOPYRROLATE 0.2 MG/ML IJ SOLN
INTRAMUSCULAR | Status: DC | PRN
  Administered 2018-07-14: 0.2 mg via INTRAVENOUS

## 2018-07-14 MED ORDER — DEXAMETHASONE SODIUM PHOSPHATE 10 MG/ML IJ SOLN
INTRAMUSCULAR | Status: DC | PRN
  Administered 2018-07-14: 10 mg via INTRAVENOUS

## 2018-07-14 MED ORDER — BUPIVACAINE HCL 0.5 % IJ SOLN
INTRAMUSCULAR | Status: DC | PRN
  Administered 2018-07-14: 30 mL

## 2018-07-14 MED ORDER — BUPIVACAINE HCL (PF) 0.5 % IJ SOLN
INTRAMUSCULAR | Status: AC
  Filled 2018-07-14: qty 30

## 2018-07-14 MED ORDER — FENTANYL CITRATE (PF) 100 MCG/2ML IJ SOLN
INTRAMUSCULAR | Status: DC | PRN
  Administered 2018-07-14 (×4): 25 ug via INTRAVENOUS

## 2018-07-14 MED ORDER — LIDOCAINE HCL (CARDIAC) PF 100 MG/5ML IV SOSY
PREFILLED_SYRINGE | INTRAVENOUS | Status: DC | PRN
  Administered 2018-07-14: 60 mg via INTRAVENOUS

## 2018-07-14 MED ORDER — PHENYLEPHRINE HCL 10 MG/ML IJ SOLN
INTRAMUSCULAR | Status: DC | PRN
  Administered 2018-07-14 (×3): 100 ug via INTRAVENOUS

## 2018-07-14 MED ORDER — ONDANSETRON HCL 4 MG/2ML IJ SOLN
INTRAMUSCULAR | Status: DC | PRN
  Administered 2018-07-14: 4 mg via INTRAVENOUS

## 2018-07-14 MED ORDER — OXYCODONE HCL 5 MG/5ML PO SOLN: 5.0000 mg | Freq: Once | ORAL | Status: DC | PRN

## 2018-07-14 MED ORDER — OXYCODONE HCL 5 MG PO TABS: 5.0000 mg | ORAL_TABLET | Freq: Once | ORAL | Status: DC | PRN

## 2018-07-14 MED ORDER — ACETAMINOPHEN 10 MG/ML IV SOLN
INTRAVENOUS | Status: AC
  Filled 2018-07-14: qty 100

## 2018-07-14 MED ORDER — PROPOFOL 10 MG/ML IV BOLUS
INTRAVENOUS | Status: AC
  Filled 2018-07-14: qty 20

## 2018-07-14 MED ORDER — HYDROMORPHONE HCL 1 MG/ML IJ SOLN
INTRAMUSCULAR | Status: AC
  Filled 2018-07-14: qty 1

## 2018-07-14 MED ORDER — CEFAZOLIN SODIUM-DEXTROSE 2-4 GM/100ML-% IV SOLN
INTRAVENOUS | Status: AC
  Filled 2018-07-14: qty 100

## 2018-07-14 MED ORDER — LIDOCAINE HCL (PF) 2 % IJ SOLN
INTRAMUSCULAR | Status: AC
  Filled 2018-07-14: qty 10

## 2018-07-14 MED ORDER — FAMOTIDINE 20 MG PO TABS
20.0000 mg | ORAL_TABLET | Freq: Once | ORAL | Status: AC
  Administered 2018-07-14: 20 mg via ORAL

## 2018-07-14 MED ORDER — FAMOTIDINE 20 MG PO TABS
ORAL_TABLET | ORAL | Status: AC
  Administered 2018-07-14: 20 mg via ORAL
  Filled 2018-07-14: qty 1

## 2018-07-14 MED ORDER — ESMOLOL HCL 100 MG/10ML IV SOLN
INTRAVENOUS | Status: DC | PRN
  Administered 2018-07-14: 10 mg via INTRAVENOUS

## 2018-07-14 MED ORDER — MIDAZOLAM HCL 2 MG/2ML IJ SOLN
INTRAMUSCULAR | Status: AC
  Filled 2018-07-14: qty 2

## 2018-07-14 MED ORDER — MIDAZOLAM HCL 2 MG/2ML IJ SOLN
INTRAMUSCULAR | Status: DC | PRN
  Administered 2018-07-14: 2 mg via INTRAVENOUS

## 2018-07-14 MED ORDER — FENTANYL CITRATE (PF) 100 MCG/2ML IJ SOLN
25.0000 ug | INTRAMUSCULAR | Status: DC | PRN
  Administered 2018-07-14 (×2): 50 ug via INTRAVENOUS

## 2018-07-14 MED ORDER — LACTATED RINGERS IV SOLN
INTRAVENOUS | Status: DC
  Administered 2018-07-14 (×2): via INTRAVENOUS

## 2018-07-14 MED ORDER — HYDROMORPHONE HCL 1 MG/ML IJ SOLN
INTRAMUSCULAR | Status: DC | PRN
  Administered 2018-07-14: 0.5 mg via INTRAVENOUS

## 2018-07-14 MED ORDER — ONDANSETRON HCL 4 MG/2ML IJ SOLN
INTRAMUSCULAR | Status: AC
  Filled 2018-07-14: qty 2

## 2018-07-14 MED ORDER — PROPOFOL 10 MG/ML IV BOLUS
INTRAVENOUS | Status: DC | PRN
  Administered 2018-07-14: 150 mg via INTRAVENOUS

## 2018-07-14 MED ORDER — ESMOLOL HCL 100 MG/10ML IV SOLN
INTRAVENOUS | Status: AC
  Filled 2018-07-14: qty 10

## 2018-07-14 MED ORDER — PHENYLEPHRINE HCL 10 MG/ML IJ SOLN
INTRAMUSCULAR | Status: AC
  Filled 2018-07-14: qty 1

## 2018-07-14 MED ORDER — DEXAMETHASONE SODIUM PHOSPHATE 10 MG/ML IJ SOLN
INTRAMUSCULAR | Status: AC
  Filled 2018-07-14: qty 1

## 2018-07-14 SURGICAL SUPPLY — 43 items
BLADE SURG 15 STRL LF DISP TIS (BLADE) ×1 IMPLANT
BLADE SURG 15 STRL SS (BLADE) ×2
CANISTER SUCT 1200ML W/VALVE (MISCELLANEOUS) ×3 IMPLANT
CHLORAPREP W/TINT 26ML (MISCELLANEOUS) ×3 IMPLANT
CLOSURE WOUND 1/2 X4 (GAUZE/BANDAGES/DRESSINGS)
COVER WAND RF STERILE (DRAPES) ×3 IMPLANT
DERMABOND ADVANCED (GAUZE/BANDAGES/DRESSINGS) ×2
DERMABOND ADVANCED .7 DNX12 (GAUZE/BANDAGES/DRESSINGS) ×1 IMPLANT
DRAIN PENROSE 1/4X12 LTX (DRAIN) ×3 IMPLANT
DRAIN PENROSE 5/8X12 LTX STRL (DRAIN) IMPLANT
DRAPE LAPAROTOMY 77X122 PED (DRAPES) ×3 IMPLANT
DRSG GAUZE FLUFF 36X18 (GAUZE/BANDAGES/DRESSINGS) ×3 IMPLANT
DRSG TEGADERM 4X4.75 (GAUZE/BANDAGES/DRESSINGS) IMPLANT
DRSG TELFA 4X3 1S NADH ST (GAUZE/BANDAGES/DRESSINGS) IMPLANT
ELECT REM PT RETURN 9FT ADLT (ELECTROSURGICAL) ×3
ELECTRODE REM PT RTRN 9FT ADLT (ELECTROSURGICAL) ×1 IMPLANT
GLOVE BIO SURGEON STRL SZ 6.5 (GLOVE) ×2 IMPLANT
GLOVE BIO SURGEONS STRL SZ 6.5 (GLOVE) ×1
GLOVE BIOGEL PI IND STRL 6.5 (GLOVE) ×1 IMPLANT
GLOVE BIOGEL PI INDICATOR 6.5 (GLOVE) ×2
GOWN STRL REUS W/ TWL LRG LVL3 (GOWN DISPOSABLE) ×2 IMPLANT
GOWN STRL REUS W/TWL LRG LVL3 (GOWN DISPOSABLE) ×4
KIT TURNOVER KIT A (KITS) ×3 IMPLANT
LABEL OR SOLS (LABEL) ×3 IMPLANT
NDL HPO THNWL 1X22GA REG BVL (NEEDLE) ×1 IMPLANT
NEEDLE SAFETY 22GX1 (NEEDLE) ×2
PACK BASIN MINOR ARMC (MISCELLANEOUS) ×3 IMPLANT
SPONGE KITTNER 5P (MISCELLANEOUS) ×3 IMPLANT
STRIP CLOSURE SKIN 1/2X4 (GAUZE/BANDAGES/DRESSINGS) IMPLANT
SUPPORETR ATHLETIC LG (MISCELLANEOUS) ×1 IMPLANT
SUPPORTER ATHLETIC LG (MISCELLANEOUS) ×3
SUT CHROMIC GUT BROWN 0 54 (SUTURE) ×1 IMPLANT
SUT CHROMIC GUT BROWN 0 54IN (SUTURE) ×3
SUT MNCRL 4-0 (SUTURE) ×2
SUT MNCRL 4-0 27XMFL (SUTURE) ×1
SUT SILK 0 SH 30 (SUTURE) ×3 IMPLANT
SUT SILK 2 0 SH (SUTURE) IMPLANT
SUT VIC AB 3-0 SH 27 (SUTURE) ×8
SUT VIC AB 3-0 SH 27X BRD (SUTURE) ×4 IMPLANT
SUT VIC AB 4-0 FS2 27 (SUTURE) IMPLANT
SUTURE MNCRL 4-0 27XMF (SUTURE) ×1 IMPLANT
SYR 20CC LL (SYRINGE) ×3 IMPLANT
WATER STERILE IRR 1000ML POUR (IV SOLUTION) ×3 IMPLANT

## 2018-07-14 NOTE — Interval H&P Note (Signed)
History and Physical Interval Note:  07/14/2018 12:26 PM  Andre Valdez  has presented today for surgery, with the diagnosis of left testicular mass  The various methods of treatment have been discussed with the patient and family. After consideration of risks, benefits and other options for treatment, the patient has consented to  Procedure(s): ORCHIECTOMY (Left) as a surgical intervention .  The patient's history has been reviewed, patient examined, no change in status, stable for surgery.  I have reviewed the patient's chart and labs.  Questions were answered to the patient's satisfaction.    RRR CTAB  Tumor markers elevated Hollice Espy

## 2018-07-14 NOTE — OR Nursing (Signed)
Dr. Erlene Quan notified via phone that there has not been an EKG clearance. Patient is scheduled to see Dr. Saunders Revel on 07/18/2018.   Dr. Amie Critchley (anesthesia) in to interview patient. Patient denies any symptoms of cardiac problems and denies SOB with walking stairs.  Dr. Amie Critchley is okay to proceed as planned. [name], interpreter is here to explain procedures to patient.

## 2018-07-14 NOTE — Discharge Instructions (Signed)
AMBULATORY SURGERY  DISCHARGE INSTRUCTIONS   1) The drugs that you were given will stay in your system until tomorrow so for the next 24 hours you should not:  A) Drive an automobile B) Make any legal decisions C) Drink any alcoholic beverage   2) You may resume regular meals tomorrow.  Today it is better to start with liquids and gradually work up to solid foods.  You may eat anything you prefer, but it is better to start with liquids, then soup and crackers, and gradually work up to solid foods.   3) Please notify your doctor immediately if you have any unusual bleeding, trouble breathing, redness and pain at the surgery site, drainage, fever, or pain not relieved by medication.    4) Additional Instructions:        Please contact your physician with any problems or Same Day Surgery at [phone], Monday through Friday 6 am to 4 pm, or [place] [facility] number at [phone].  Orquiectoma, cuidados posteriores Orchiectomy, Care After Lea esta informacin sobre cmo cuidarse despus del procedimiento. El mdico tambin podr darle instrucciones ms especficas. Comunquese con su mdico si tiene problemas o preguntas. Qu puedo esperar despus del procedimiento? Despus del procedimiento, es comn [employer] siguientes sntomas:  Social research officer, government.  Moretones.  Acumulacin de sangre (hematoma) en el rea donde los testculos fueron extirpados.  Depresin o cambios en el estado de nimo.  Fatiga.  Acaloramiento. Siga estas indicaciones en su casa: Control del dolor y de la hinchazn  Si se lo indican, aplique hielo sobre la zona afectada: ? Ponga el hielo en una bolsa plstica. ? Coloque una [employer] piel y la bolsa de hielo. ? Coloque el hielo durante 54mnutos, de 2 a 3veces por da.  Use un soporte escrotal como se lo haya indicado el mdico.  Para aliviar la presin y el dolor cuando est sentado, puede usar un cojn con forma de anillo o aro  si se lo indica el mdico. Cuidados de la incisin   Siga las indicaciones del mdico acerca del cuidado de la incisin. Haga lo siguiente: ? Lvese las manos con agua y jabn antes de cambiar la venda (vendaje). Use desinfectante para manos si no dispone de a[place]y jRe[place] ? Cambie el vendaje una vez al da o con la frecuencia que le haya indicado el mdico. Si el vendaje se pega a la zona de la incisin: ? Use agua tibia jabonosa o perxido de hidrgeno para humedecer el vendaje. ? Cuando el vendaje se afloje, retrelo de la zona de la incisin. Asegrese de que la incisin se mantenga cerrada. ? No retire los puntos (suturas), la goma para cerrar la piel o las tiras a[place]se[place] Es posible que estos cierres cutneos dAnimal nutritionisten la piel durante 2semanas o ms. Si los bordes de las tiras adhesivas empiezan a despegarse y eTherapist, sports puede recortar los que estn sueltos. No retire las tiras aTriad Hospitalspor completo a menos que el mdico se lo indique.  Mantenga el vendaje seco hasta que se lo quiten.  CNorth Charleroizona de la incisin para detectar signos de infeccin. Est atento a los siguientes signos: ? Aumento del enrojecimiento, de la hinchazn o del dolor. ? Ms lquido oDelorise Shiner ? Calor. ? Pus o mal olor. Baarse  No tome baos de inmersin, no nade ni use el jacuzzi hasta que el mdico lo autorice. Puede comenzar a dBarnes & Nobledel procedimiento.  No frote  la incisin para secarla. Seque la zona suavemente con un pao limpio sin frotar o djela secar al aire. Medicamentos  [employer] de venta libre y los recetados solamente como se lo haya indicado el mdico.  Si le recetaron un antibitico, tmelo como se lo haya indicado el mdico. No deje de usar el antibitico aunque comience a Sports administrator.  Si le extirparon ambos testculos, hable con el mdico sobre tomar suplementos medicinales para reemplazar una de las hormonas masculinas  (testosterona) que su cuerpo ya no producir. Conducir  No conduzca durante 24horas si le dieron un medicamento para ayudarlo a que se relaje (sedante).  No conduzca ni use maquinaria pesada mientras toma analgsicos recetados. Actividad  Evite las actividades que puedan hacer que se abra la incisin, como trotar, Psychologist, prison and probation services deportes y hacer fuerza para defecar. Pregntele al mdico qu actividades son seguras para usted.  No levante ningn objeto que pese ms de 10libras (4,5kg) o el lmite de peso que le indique su mdico hasta que l le diga que puede [place].  No tenga relaciones sexuales hasta que la zona se cure y el mdico lo apruebe. Esto podra tardar hasta 4 semanas. Instrucciones generales  A fin de prevenir o tratar el estreimiento mientras toma analgsicos recetados, el mdico puede recomendarle lo siguiente: ? Beber suficiente lquido como para mantener la orina clara o de color amarillo plido. ? Tomar medicamentos recetados o de [employer]. ? Consumir alimentos ricos en fibra, como frutas y verduras frescas, cereales integrales y frijoles. ? Limitar el consumo de alimentos ricos en grasa y azcares procesados, como alimentos fritos o dulces.  No consuma ningn producto que contenga nicotina o tabaco, como cigarrillos y Psychologist, sport and exercise. Si necesita ayuda para dejar de fumar, consulte al mdico.  Concurra a todas las visitas de control como se lo haya indicado el mdico. Esto es importante. Comunquese con un mdico si:  Tiene ms dolor, hinchazn o enrojecimiento en los genitales o en la zona de la ingle.  Le sale ms lquido o sangre de la incisin.  La incisin est caliente al tacto.  Tiene pus o percibe que sale mal olor del lugar de la incisin.  Tiene estreimiento que no mejora al [employer] la alimentacin o al aumentar la ingesta de lquidos.  Siente nuseas o vmitos.  No puede comer ni beber sin vomitar. Solicite ayuda de inmediato  si:  Tiene mareos o nuseas que no desaparecen.  Tiene dificultad para respirar.  Tiene tos hmeda (congestiva).  Siente escalofros o fiebre.  La incisin se abre luego de que le hayan extrado los cierres cutneos.  No puede orinar. Resumen  Despus de este procedimiento, es ms comn tener hematomas o acumulacin de Armed forces logistics/support/administrative officer donde los testculos fueron extirpados.  Debe controlar la zona de la incisin [employer] para Hydrographic surveyor signos de infeccin, como enrojecimiento, hinchazn, [place], lquido, [place], calor, pus o mal olor.  Evite las actividades que puedan hacer que se abra la incisin, como trotar, Psychologist, prison and probation services deportes y hacer fuerza para defecar. Pregntele al mdico qu actividades son seguras para usted.  No debe tener relaciones sexuales hasta que la zona se cure y el mdico lo apruebe. Esto podra tardar hasta 4 semanas.  Los hombres a quienes se les extirpan ambos testculos pueden tener efectos secundarios emocionales y fsicos. El mdico puede ayudarlo al recomendarle maneras de manejar esos efectos secundarios. Esta informacin no tiene Marine scientist el consejo del mdico. Asegrese de hacerle al mdico  cualquier pregunta que tenga. Document Released: 01/21/2013 Document Revised: 02/26/2017 Document Reviewed: 02/26/2017 Elsevier Interactive Patient Education  2019 [employer].   Orchiectomy, Care After This sheet gives you information about how to care for yourself after your procedure. Your health care provider may also give you more specific instructions. If you have problems or questions, contact your health care provider. What can I expect after the procedure? After the procedure, it is common to have: Pain. Bruising. Blood pooling (hematoma) in the area where your testicles were removed. Depression or mood changes. Fatigue. Hot flashes. Follow these instructions at home: Managing pain and swelling If directed, put ice on the affected  area: Put ice in a plastic bag. Place a towel between your skin and the bag. Leave the ice on for 20 minutes, 2-3 times a day. Wear scrotal support as told by your health care provider. To relieve pressure and pain when sitting, you may use a donut cushion if directed by your health care provider. Incision care  Follow instructions from your health care provider about how to take care of your incision. Make sure you: Wash your hands with soap and water before you change your bandage (dressing). If soap and water are not available, use hand sanitizer. Change your dressing once a day, or as often as told by your health care provider. If the dressing sticks to your incision area: Use warm, soapy water or hydrogen peroxide to dampen the dressing. When the dressing becomes loose, lift it from the incision area. Make sure that the incision stays closed. Leave stitches (sutures), skin glue, or adhesive strips in place. These skin closures may need to stay in place for 2 weeks or longer. If adhesive strip edges start to loosen and curl up, you may trim the loose edges. Do not remove adhesive strips completely unless your health care provider tells you to do that. Keep your dressing dry until it has been removed. Check your incision area every day for signs of infection. Check for: More redness, swelling, or pain. More fluid or blood. Warmth. Pus or a bad smell. Bathing Do not take baths, swim, or use a hot tub until your health care provider approves. You may start taking showers two days after your procedure. Do not rub your incision to dry it. Pat the area gently with a clean cloth or let it air-dry. Medicines Take over-the-counter and prescription medicines only as told by your health care provider. If you were prescribed an antibiotic medicine, use it as told by your health care provider. Do not stop using the antibiotic even if you start to feel better. If you had both testicles removed, talk  with your health care provider about medicine supplements to replace one of the male hormones (testosterone) that your body will no longer make. Driving Do not drive for 24 hours if you were given a medicine to help you relax (sedative). Do not drive or use heavy machinery while taking prescription pain medicines. Activity Avoid activities that may cause your incision to open, such as jogging, playing sports, and straining with bowel movements. Ask your health care provider what activities are safe for you. Do not lift anything that is heavier than 10 lb (4.5 kg), or the limit that your health care provider tells you, until he or she says that it is safe. Do not engage in sexual activity until the area is healed and your health care provider approves. This could take up to 4 weeks. General instructions To  prevent or treat constipation while you are taking prescription pain medicine, your health care provider may recommend that you: Drink enough fluid to keep your urine clear or pale yellow. Take over-the-counter or prescription medicines. Eat foods that are high in fiber, such as fresh fruits and vegetables, whole grains, and beans. Limit foods that are high in fat and processed sugars, such as fried and sweet foods. Do not use any products that contain nicotine or tobacco, such as cigarettes and e-cigarettes. If you need help quitting, ask your health care provider. Keep all follow-up visits as told by your health care provider. This is important. Contact a health care provider if: You have more pain, swelling, or redness in your genital or groin area. You have more fluid or blood coming from your incision. Your incision feels warm to the touch. You have pus or a bad smell coming from your incision. You have constipation that is not helped by changing your diet or drinking more fluid. You develop nausea or vomiting. You cannot eat or drink without vomiting. Get help right away if: You have  dizziness or nausea that does not go away. You have trouble breathing. You have a wet (congested) cough. You have a fever or shaking chills. Your incision breaks open after the skin closures have been removed. You are not able to urinate. Summary After this procedure, it is most common to have bruising or blood pooling in the area where the testicles were removed. You should check your incision area every day for signs of infection, such as redness, swelling, pain, fluid, blood, warmth, pus, or a bad smell. Avoid activities that may cause your incision to open, such as jogging, playing sports, and straining with bowel movements. Ask your health care provider what activities are safe for you. You should not engage in sexual activity until the area is healed and your health care provider approves. This could take up to 4 weeks. Men who have both testicles removed may have emotional and physical side effects. Your health care provider can help you with ways to manage those side effects. This information is not intended to replace advice given to you by your health care provider. Make sure you discuss any questions you have with your health care provider. Document Released: 01/21/2013 Document Revised: 04/12/2016 Document Reviewed: 04/12/2016 Elsevier Interactive Patient Education  2019 [employer].

## 2018-07-14 NOTE — Anesthesia Post-op Follow-up Note (Signed)
Anesthesia QCDR form completed.        

## 2018-07-14 NOTE — Anesthesia Preprocedure Evaluation (Addendum)
Anesthesia Evaluation  Patient identified by MRN, date of birth, ID band Patient awake    Reviewed: Allergy & Precautions, H&P , NPO status , Patient's Chart, lab work & pertinent test results  History of Anesthesia Complications Negative for: history of anesthetic complications  Airway Mallampati: II  TM Distance: <3 FB Neck ROM: full    Dental  (+) Chipped, Poor Dentition   Pulmonary neg shortness of breath, former smoker,           Cardiovascular Exercise Tolerance: Good (-) angina(-) Past MI and (-) DOE negative cardio ROS       Neuro/Psych negative neurological ROS  negative psych ROS   GI/Hepatic negative GI ROS, (+)     substance abuse  cocaine use,   Endo/Other  negative endocrine ROS  Renal/GU      Musculoskeletal   Abdominal   Peds  Hematology negative hematology ROS (+)   Anesthesia Other Findings History reviewed. No pertinent past medical history.  History reviewed. No pertinent surgical history.     Reproductive/Obstetrics negative OB ROS                             Anesthesia Physical Anesthesia Plan  ASA: II  Anesthesia Plan: General LMA   Post-op Pain Management:    Induction: Intravenous  PONV Risk Score and Plan: Dexamethasone, Ondansetron, Midazolam and Treatment may vary due to age or medical condition  Airway Management Planned: LMA  Additional Equipment:   Intra-op Plan:   Post-operative Plan: Extubation in OR  Informed Consent: I have reviewed the patients History and Physical, chart, labs and discussed the procedure including the risks, benefits and alternatives for the proposed anesthesia with the patient or authorized representative who has indicated his/her understanding and acceptance.     Dental Advisory Given  Plan Discussed with: Anesthesiologist, CRNA and Surgeon  Anesthesia Plan Comments: (Consent and history via interpreter    Patient with medical clearance and asymptomatic from a cardiac standpoint.  Patient consented for risks of anesthesia including but not limited to:  - adverse reactions to medications - damage to teeth, lips or other oral mucosa - sore throat or hoarseness - Damage to heart, brain, lungs or loss of life  Patient voiced understanding.)       Anesthesia Quick Evaluation

## 2018-07-14 NOTE — Anesthesia Procedure Notes (Signed)
Procedure Name: LMA Insertion Date/Time: 07/14/2018 12:44 PM Performed by: Dionne Bucy, CRNA Pre-anesthesia Checklist: Patient identified, Patient being monitored, Timeout performed, Emergency Drugs available and Suction available Patient Re-evaluated:Patient Re-evaluated prior to induction Oxygen Delivery Method: Circle system utilized Preoxygenation: Pre-oxygenation with 100% oxygen Induction Type: IV induction Ventilation: Mask ventilation without difficulty LMA: LMA inserted LMA Size: 4.0 Tube type: Oral Number of attempts: 1 Placement Confirmation: positive ETCO2 and breath sounds checked- equal and bilateral Tube secured with: Tape Dental Injury: Teeth and Oropharynx as per pre-operative assessment

## 2018-07-14 NOTE — Op Note (Signed)
Preoperative diagnosis:  1. left testicular mass  Postoperative diagnosis:  1. left testicular mass  Procedure: 1. left radical orchiectomy (inguinal approach)  Surgeon: Hollice Espy, MD  Anesthesia: General  Complications: None  Intraoperative findings: Firm left testicular mass palpable.  EBL: Minimal  Specimens: left testicle  Drains: None  Indication: This is a 23 y.o. 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 Tumor markers elevated. He is counseled to undergo left radical inguinal orchiectomy. After reviewing the management options for treatment, he elected to proceed with the above surgical procedure(s). We have discussed the potential benefits and risks of the procedure, side effects of the proposed treatment, the likelihood of the patient achieving the goals of the procedure, and any potential problems that might occur during the procedure or recuperation. Informed consent has been obtained.  Description of procedure:  The patient was taken to the operating room and general anesthesia was induced. The patient was placed in the supine position, shaved, prepped and draped in the usual sterile fashion, and preoperative antibiotics were administered. A preoperative time-out was performed.   At this point in time, an approximately 7 cm right subinguinal incision was created transversely using a 15 blade (larger than normal to accommodate large testicular size). Prior to making the incision, I did do physical exam to confirm that the left testicular was the abnormal testicle. In addition, I did place half percent Marcaine plain at the site of the planned incision. Incision was carried down through the subcutaneous tissues using Bovie electrocautery until the cord structures and abdominal wall were identified. The cord was gently lifted using a Babcock and a plane was created underneath the cord by  elevating it. A Penrose drain was then wrapped circumferentially around the cord for hemostatic control. The lef testicle was then delivered into the field by applying traction on the cord and everting the right hemiscrotum. The gubernaculum was then obliterated using a combination of Bovie electrocautery along with chromic ties. Once this was freed, the cord was dissected out proximally to the external ring. The ilioinguinal nerve was identified and care taken to avoid any damage or injury to the structure. The overlying external oblique fascia was incised towards the internal ring and the right cord was freed and additional several centimeters proximal. A large Claiborne Billings x2  was then placed on the cord at the proximal most location at the level of the right internal ring (cord divided into two packets). Another Claiborne Billings was placed more distally. The cord was then transected and the testicle was passed off the field as right testicle. The cord stump was ligated using 0-0 silk 2. An additional tie was placed across the entire cord stump and along tail was left using silk suture as a tag for later identification as needed. The cord remnant was then dunked into the internal ring. The overlying external oblique fascia was closed using a running 3-0 Vicryl suture again with care taken to avoid any injury to the ilioinguinal nerve. The wound was then irrigated. The scrotum was everted to ensure that there was no residual bleeding of the gubernaculum. Once hemostasis was adequate, the wound was closed in 2 layers using a 3-0 Vicryl suture in a simple interrupted fashion to close the Scarpa's fascia followed by a 4-0 Monocryl subcuticular suture. The wound was then cleaned and dried. Dermabond was applied to the incision site. The scrotal fluffs and a scrotal support devices were applied. He was then  reversed from anesthesia and taken to the PACU in stable condition.  Plan: patient will follow-up in one week to review  pathology results.  Hollice Espy, M.D.

## 2018-07-14 NOTE — Anesthesia Postprocedure Evaluation (Signed)
Anesthesia Post Note  Patient: Andre Valdez  Procedure(s) Performed: ORCHIECTOMY (Left Inguinal)  Patient location during evaluation: PACU Anesthesia Type: General Level of consciousness: awake and alert Pain management: pain level controlled Vital Signs Assessment: post-procedure vital signs reviewed and stable Respiratory status: spontaneous breathing, nonlabored ventilation, respiratory function stable and patient connected to nasal cannula oxygen Cardiovascular status: blood pressure returned to baseline and stable Postop Assessment: no apparent nausea or vomiting Anesthetic complications: no     Last Vitals:  Vitals:   07/14/18 1450 07/14/18 1503  BP: (!) 144/90 (!) 153/92  Pulse: (!) 104 (!) 108  Resp: 17 18  Temp: 36.8 C 36.7 C  SpO2: 96% 98%    Last Pain:  Vitals:   07/14/18 1503  TempSrc: Temporal  PainSc: 3                  Precious Haws Piscitello

## 2018-07-14 NOTE — Transfer of Care (Signed)
Immediate Anesthesia Transfer of Care Note  Patient: Andre Valdez  Procedure(s) Performed: ORCHIECTOMY (Left Inguinal)  Patient Location: PACU  Anesthesia Type:General  Level of Consciousness: awake, alert , oriented and patient cooperative  Airway & Oxygen Therapy: Patient Spontanous Breathing  Post-op Assessment: Report given to RN and Post -op Vital signs reviewed and stable  Post vital signs: Reviewed and stable  Last Vitals:  Vitals Value Taken Time  BP 144/90 07/14/2018  2:21 PM  Temp 36.2 C 07/14/2018  2:20 PM  Pulse 102 07/14/2018  2:26 PM  Resp 13 07/14/2018  2:26 PM  SpO2 98 % 07/14/2018  2:26 PM  Vitals shown include unvalidated device data.  Last Pain:  Vitals:   07/14/18 1420  TempSrc:   PainSc: 5          Complications: No apparent anesthesia complications

## 2018-07-15 ENCOUNTER — Encounter: Payer: Self-pay | Admitting: Urology

## 2018-07-15 ENCOUNTER — Telehealth: Payer: Self-pay | Admitting: Urology

## 2018-07-15 NOTE — Telephone Encounter (Signed)
Sent message to Manuela Schwartz in scheduling to see if this could get scheduled prior to his follow up app   Sharyn Lull

## 2018-07-15 NOTE — Telephone Encounter (Signed)
-----   Message from Hollice Espy, MD sent at 07/14/2018  2:26 PM EST ----- Regarding: post op I would like this patient to follow-up with me in 1- 2 weeks postop.  A CT scan was ordered by [facility] as well as chest x-ray.  Could you please try to have this scheduled before he comes back and sees me.  Also, a Spanish interpreter would be needed.  Hollice Espy, MD

## 2018-07-18 ENCOUNTER — Encounter: Payer: Self-pay | Admitting: Internal Medicine

## 2018-07-18 ENCOUNTER — Ambulatory Visit (INDEPENDENT_AMBULATORY_CARE_PROVIDER_SITE_OTHER): Payer: Self-pay | Admitting: Internal Medicine

## 2018-07-18 VITALS — BP 126/60 | HR 71 | Ht 64.0 in | Wt 138.0 lb

## 2018-07-18 DIAGNOSIS — F1910 Other psychoactive substance abuse, uncomplicated: Secondary | ICD-10-CM

## 2018-07-18 DIAGNOSIS — R0602 Shortness of breath: Secondary | ICD-10-CM | POA: Insufficient documentation

## 2018-07-18 DIAGNOSIS — R9431 Abnormal electrocardiogram [ECG] [EKG]: Secondary | ICD-10-CM

## 2018-07-18 DIAGNOSIS — R0789 Other chest pain: Secondary | ICD-10-CM | POA: Insufficient documentation

## 2018-07-18 NOTE — Progress Notes (Signed)
New Outpatient Visit Date: 07/18/2018  Referring Provider: Hollice Espy, MD [address] [place], [place] [id number]  Chief Complaint: Abnormal EKG  HPI:  Andre Valdez is a 23 y.o. male who is being seen today for the evaluation of abnormal EKG at the request of Dr. Erlene Quan. 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  As part of preoperative evaluation, EKG was performed and was interpreted as normal sinus rhythm with nonspecific T wave abnormality.  He proceeded with his urologic procedure without any complications.  Today, Mr. Andre Valdez reports that he is feeling well.  With the help of a Spanish interpreter, he notes occasional episodes of vague chest discomfort and shortness of breath, particularly when trying to take a deep breath.  This seems to have been present for at least a year, though he cannot provide any further details regarding the symptoms.  They do not seem to be exertional.  He notes that he smoked and consumed significant quantities of alcohol and cocaine up until about 3 months ago.  He denies a history of personal or family cardiac disease.  He did not have any significant illness as a child.  --------------------------------------------------------------------------------------------------  Cardiovascular History & Procedures: Cardiovascular Problems:  Abnormal EKG  Chest pain and shortness of breath  Risk Factors:  Tobacco and cocaine use  Cath/PCI:  None  CV Surgery:  None  EP Procedures and Devices:  None  Non-Invasive Evaluation(s):  None  Recent CV Pertinent Labs: Lab Results  Component Value Date   K 3.9 07/10/2018   BUN 9 07/10/2018   CREATININE 0.70 (L) 07/10/2018    --------------------------------------------------------------------------------------------------  Past Medical History:  Diagnosis Date  . Testicular cancer [facility])     Past Surgical  History:  Procedure Laterality Date  . ORCHIECTOMY Left 07/14/2018   Procedure: ORCHIECTOMY;  Surgeon: Hollice Espy, MD;  Location: [facility] ORS;  Service: Urology;  Laterality: Left;    No outpatient medications have been marked as taking for the 07/18/18 encounter (Office Visit) with Almir Botts, Harrell Gave, MD.    Allergies: Patient has no known allergies.  Social History   Tobacco Use  . Smoking status: Former Smoker    Types: Cigarettes    Last attempt to quit: 06/09/2018    Years since quitting: 0.1  . Smokeless tobacco: Never Used  Substance Use Topics  . Alcohol use: Not Currently    Comment: drank heavily until ~3 months ago.  . Drug use: Not Currently    Types: Codeine, Cocaine, Marijuana    Comment: Last used cocaine 3 months ago    Family History  Problem Relation Age of Onset  . Heart disease Neg Hx   . Sudden Cardiac Death Neg Hx     Review of Systems: A 12-system review of systems was performed and was negative except as noted in the HPI.  --------------------------------------------------------------------------------------------------  Physical Exam: BP 126/60 (BP Location: Right Arm, Patient Position: Sitting, Cuff Size: Normal)   Pulse 71   Ht 5\' 4"  (1.626 m)   Wt 138 lb (62.6 kg)   BMI 23.69 kg/m   General: NAD.  Accompanied by Spanish interpreter. HEENT: No conjunctival pallor or scleral icterus. Moist mucous membranes. OP clear. Neck: Supple without lymphadenopathy, thyromegaly, JVD, or HJR. No carotid bruit. Lungs: Normal work of breathing. Clear to auscultation bilaterally without wheezes or crackles. Heart: Regular rate and rhythm without murmurs, rubs, or gallops. Non-displaced PMI. Abd: Bowel sounds present. Soft,  NT/ND without hepatosplenomegaly Ext: No lower extremity edema. Radial, PT, and DP pulses are 2+ bilaterally Skin: Warm and dry without rash. Neuro: CNIII-XII intact. Strength and fine-touch sensation intact in upper and lower extremities  bilaterally. Psych: Normal mood and affect.  EKG: Normal sinus rhythm without abnormality.  Lab Results  Component Value Date   WBC 15.0 (H) 07/10/2018   HGB 15.4 07/10/2018   HCT 45.8 07/10/2018   MCV 88.6 07/10/2018   PLT 469 (H) 07/10/2018    Lab Results  Component Value Date   NA 137 07/10/2018   K 3.9 07/10/2018   CL 97 07/10/2018   CO2 23 07/10/2018   BUN 9 07/10/2018   CREATININE 0.70 (L) 07/10/2018   GLUCOSE 96 07/10/2018   ALT 81 (H) 07/10/2018    No results found for: CHOL, HDL, LDLCALC, LDLDIRECT, TRIG, CHOLHDL   --------------------------------------------------------------------------------------------------  ASSESSMENT AND PLAN: Abnormal EKG Today's EKG is normal.  I have reviewed preoperative tracings from 07/10/2018.  Findings on those tracings are consistent with a limb lead reversal.  Chest pain and shortness of breath Symptoms are atypical and difficult to elicit even with the help of an interpreter.  Given the patient's young age, CAD is quite unlikely.  However, given prior tobacco and cocaine use, underlying cardiac disease is a consideration.  As above, EKG today is normal.  Physical exam is also normal without evidence of heart failure.  I have recommended that we obtain a transthoracic echocardiogram given history of polysubstance abuse as well as potential need for chemotherapy or radiation in the future.  If echocardiogram is normal, no further work-up will be recommended.  I encouraged Mr. Andre Swander to remain abstinent of tobacco, cocaine, and alcohol.  Polysubstance abuse Patient reports history of tobacco, alcohol, and cocaine use.  He has been abstinent for about 3 months, which I congratulated him on.  I encouraged him to continue avoiding these substances.  Follow-up: Return to clinic as needed based on symptoms and results of echocardiogram.  Nelva Bush, MD 07/18/2018 1:14 PM

## 2018-07-18 NOTE — Patient Instructions (Signed)
Medication Instructions:  Your physician recommends that you continue on your current medications as directed. Please refer to the Current Medication list given to you today.  If you need a refill on your cardiac medications before your next appointment, please call your pharmacy.   Lab work: none If you have labs (blood work) drawn today and your tests are completely normal, you will receive your results only by: Marland Kitchen MyChart Message (if you have MyChart) OR . A paper copy in the mail If you have any lab test that is abnormal or we need to change your treatment, we will call you to review the results.  Testing/Procedures: Your physician has requested that you have an echocardiogram. Echocardiography is a painless test that uses sound waves to create images of your heart. It provides your doctor with information about the size and shape of your heart and how well your heart's chambers and valves are working. This procedure takes approximately one hour. There are no restrictions for this procedure. You may get an IV, if needed, to receive an ultrasound enhancing agent through to better visualize your heart.    Follow-Up: At [facility], you and your health needs are our priority.  As part of our continuing mission to provide you with exceptional heart care, we have created designated Provider Care Teams.  These Care Teams include your primary Cardiologist (physician) and Advanced Practice Providers (APPs -  Physician Assistants and Nurse Practitioners) who all work together to provide you with the care you need, when you need it. You will need a follow up appointment in AS NEEDED.

## 2018-07-20 ENCOUNTER — Other Ambulatory Visit: Payer: Self-pay

## 2018-07-20 ENCOUNTER — Emergency Department
Admission: EM | Admit: 2018-07-20 | Discharge: 2018-07-20 | Disposition: A | Discharge status: Home / Self Care | Payer: Self-pay | Attending: Emergency Medicine | Admitting: Emergency Medicine

## 2018-07-20 DIAGNOSIS — L233 Allergic contact dermatitis due to drugs in contact with skin: Secondary | ICD-10-CM | POA: Insufficient documentation

## 2018-07-20 DIAGNOSIS — Z87891 Personal history of nicotine dependence: Secondary | ICD-10-CM | POA: Insufficient documentation

## 2018-07-20 DIAGNOSIS — Z8547 Personal history of malignant neoplasm of testis: Secondary | ICD-10-CM | POA: Insufficient documentation

## 2018-07-20 LAB — URINALYSIS, COMPLETE (UACMP) WITH MICROSCOPIC
Bacteria, UA: NONE SEEN
Bilirubin Urine: NEGATIVE
Glucose, UA: NEGATIVE mg/dL
Hgb urine dipstick: NEGATIVE
Ketones, ur: NEGATIVE mg/dL
Leukocytes,Ua: NEGATIVE
Nitrite: NEGATIVE
Protein, ur: NEGATIVE mg/dL
Specific Gravity, Urine: 1.011 (ref 1.005–1.030)
Squamous Epithelial / HPF: NONE SEEN (ref 0–5)
pH: 7 (ref 5.0–8.0)

## 2018-07-20 LAB — CBC WITH DIFFERENTIAL/PLATELET
Abs Immature Granulocytes: 0.02 10*3/uL (ref 0.00–0.07)
Basophils Absolute: 0.1 10*3/uL (ref 0.0–0.1)
Basophils Relative: 1 %
Eosinophils Absolute: 1.8 10*3/uL — ABNORMAL HIGH (ref 0.0–0.5)
Eosinophils Relative: 20 %
HCT: 49.1 % (ref 39.0–52.0)
Hemoglobin: 16.5 g/dL (ref 13.0–17.0)
IMMATURE GRANULOCYTES: 0 %
Lymphocytes Relative: 15 %
Lymphs Abs: 1.3 10*3/uL (ref 0.7–4.0)
MCH: 30 pg (ref 26.0–34.0)
MCHC: 33.6 g/dL (ref 30.0–36.0)
MCV: 89.3 fL (ref 80.0–100.0)
MONOS PCT: 9 %
Monocytes Absolute: 0.8 10*3/uL (ref 0.1–1.0)
NEUTROS PCT: 55 %
Neutro Abs: 5 10*3/uL (ref 1.7–7.7)
Platelets: 463 10*3/uL — ABNORMAL HIGH (ref 150–400)
RBC: 5.5 MIL/uL (ref 4.22–5.81)
RDW: 11.9 % (ref 11.5–15.5)
WBC: 9 10*3/uL (ref 4.0–10.5)
nRBC: 0 % (ref 0.0–0.2)

## 2018-07-20 LAB — COMPREHENSIVE METABOLIC PANEL
ALBUMIN: 4.8 g/dL (ref 3.5–5.0)
ALT: 45 U/L — ABNORMAL HIGH (ref 0–44)
AST: 27 U/L (ref 15–41)
Alkaline Phosphatase: 102 U/L (ref 38–126)
Anion gap: 5 (ref 5–15)
BUN: 10 mg/dL (ref 6–20)
CO2: 30 mmol/L (ref 22–32)
Calcium: 9.1 mg/dL (ref 8.9–10.3)
Chloride: 101 mmol/L (ref 98–111)
Creatinine, Ser: 0.66 mg/dL (ref 0.61–1.24)
GFR calc Af Amer: >60 mL/min (ref >60)
GFR calc non Af Amer: >60 mL/min (ref >60)
Glucose, Bld: 107 mg/dL — ABNORMAL HIGH (ref 70–99)
Potassium: 3.7 mmol/L (ref 3.5–5.1)
Sodium: 136 mmol/L (ref 135–145)
Total Bilirubin: 0.6 mg/dL (ref 0.3–1.2)
Total Protein: 8.5 g/dL — ABNORMAL HIGH (ref 6.5–8.1)

## 2018-07-20 MED ORDER — DIPHENHYDRAMINE-ZINC ACETATE 2-0.1 % EX CREA: 1.0000 "application " | TOPICAL_CREAM | Freq: Three times a day (TID) | CUTANEOUS | 0 refills | Status: AC | PRN

## 2018-07-20 MED ORDER — DIPHENHYDRAMINE HCL 25 MG PO CAPS
50.0000 mg | ORAL_CAPSULE | Freq: Once | ORAL | Status: AC
  Administered 2018-07-20: 50 mg via ORAL
  Filled 2018-07-20: qty 2

## 2018-07-20 NOTE — ED Notes (Signed)
Pt assessed by this RN as well as Dr Alfred Levins. Pt had his testicle removed last Monday, pt is here c/o pain/burning/itching to the perineal and inquinal area. Pt appears to have a rash to the pre-op areas mentioned. Pt has an incision to his left lower abd that appears to be healing and surgical glue cont to be intact. Pt reports that he has been scratching theses areas a lot. No distress noted at this time, pt was able to tol Dr Loman [place] exam with ease

## 2018-07-20 NOTE — ED Notes (Signed)
Pt up to bathroom without diff, cont to monitor

## 2018-07-20 NOTE — ED Provider Notes (Signed)
[facility] Emergency Department Provider Note  ____________________________________________  Time seen: Approximately 2:39 PM  I have reviewed the triage vital signs and the nursing notes.   HISTORY  Chief Complaint Post-op Problem   HPI Andre Valdez is a 23 y.o. male with a history of testicular cancer status post left radical orchiectomy 6 days ago presents for evaluation of rash.  Patient reports that he developed a pruritic rash in his perineum which has been getting progressively worse over the last few days.  No fever or chills, no dysuria or hematuria.  He does have some left-sided testicular pain however that is unchanged since postop.  Has been taking Percocet which helps.  No abdominal pain.   Past Medical History:  Diagnosis Date  . Testicular cancer [facility])     Patient Active Problem List   Diagnosis Date Noted  . Abnormal EKG 07/18/2018  . SOB (shortness of breath) 07/18/2018  . Atypical chest pain 07/18/2018  . Polysubstance abuse ([place]) 07/18/2018    Past Surgical History:  Procedure Laterality Date  . ORCHIECTOMY Left 07/14/2018   Procedure: ORCHIECTOMY;  Surgeon: Hollice Espy, MD;  Location: [facility] ORS;  Service: Urology;  Laterality: Left;    Prior to Admission medications   Medication Sig Start Date End Date Taking? Authorizing Provider  diphenhydrAMINE-zinc acetate (BENADRYL EXTRA STRENGTH) cream Apply 1 application topically 3 (three) times daily as needed for itching. 07/20/18   Rudene Re, MD  oxyCODONE-acetaminophen (PERCOCET) 5-325 MG tablet Take 1 tablet by mouth every 4 (four) hours as needed. 07/10/18 07/10/19  Gregor Hams, MD    Allergies Patient has no known allergies.  Family History  Problem Relation Age of Onset  . Heart disease Neg Hx   . Sudden Cardiac Death Neg Hx     Social History Social History   Tobacco Use  . Smoking status: Former Smoker    Types: Cigarettes    Last attempt  to quit: 06/09/2018    Years since quitting: 0.1  . Smokeless tobacco: Never Used  Substance Use Topics  . Alcohol use: Not Currently    Comment: drank heavily until ~3 months ago.  . Drug use: Not Currently    Types: Codeine, Cocaine, Marijuana    Comment: Last used cocaine 3 months ago    Review of Systems  Constitutional: Negative for fever. Eyes: Negative for visual changes. ENT: Negative for sore throat. Neck: No neck pain  Cardiovascular: Negative for chest pain. Respiratory: Negative for shortness of breath. Gastrointestinal: Negative for abdominal pain, vomiting or diarrhea. Genitourinary: Negative for dysuria. Musculoskeletal: Negative for back pain. Skin: + rash. Neurological: Negative for headaches, weakness or numbness. Psych: No SI or HI  ____________________________________________   PHYSICAL EXAM:  VITAL SIGNS: ED Triage Vitals  Enc Vitals Group     BP 07/20/18 1248 131/73     Pulse Rate 07/20/18 1248 77     Resp 07/20/18 1248 16     Temp 07/20/18 1248 99.1 F (37.3 C)     Temp Source 07/20/18 1248 Oral     SpO2 07/20/18 1248 98 %     Weight 07/20/18 1302 150 lb (68 kg)     Height 07/20/18 1302 5\' 5"  (1.651 m)     Head Circumference --      Peak Flow --      Pain Score 07/20/18 1301 8     Pain Loc --      Pain Edu? --  Excl. in [place]? --     Constitutional: Alert and oriented. Well appearing and in no apparent distress. HEENT:      Head: Normocephalic and atraumatic.         Eyes: Conjunctivae are normal. Sclera is non-icteric.       Mouth/Throat: Mucous membranes are moist.       Neck: Supple with no signs of meningismus. Cardiovascular: Regular rate and rhythm. No murmurs, gallops, or rubs. 2+ symmetrical distal pulses are present in all extremities. No JVD. Respiratory: Normal respiratory effort. Lungs are clear to auscultation bilaterally. No wheezes, crackles, or rhonchi.  Gastrointestinal: Soft, non tender, and non distended with positive  bowel sounds. No rebound or guarding. no tenderness or erythema of the scrotum Musculoskeletal: Nontender with normal range of motion in all extremities. No edema, cyanosis, or erythema of extremities. Neurologic: Normal speech and language. Face is symmetric. Moving all extremities. No gross focal neurologic deficits are appreciated. Skin: Skin is warm, dry and intact.  Erythematous blanching rash involving the prepped surgical area, well healing surgical incision, no crepitus, no necrosis Psychiatric: Mood and affect are normal. Speech and behavior are normal.  ____________________________________________   LABS (all labs ordered are listed, but only abnormal results are displayed)  Labs Reviewed  COMPREHENSIVE METABOLIC PANEL - Abnormal; Notable for the following components:      Result Value   Glucose, Bld 107 (*)    Total Protein 8.5 (*)    ALT 45 (*)    All other components within normal limits  CBC WITH DIFFERENTIAL/PLATELET - Abnormal; Notable for the following components:   Platelets 463 (*)    Eosinophils Absolute 1.8 (*)    All other components within normal limits  URINALYSIS, COMPLETE (UACMP) WITH MICROSCOPIC - Abnormal; Notable for the following components:   Color, Urine YELLOW (*)    APPearance CLOUDY (*)    All other components within normal limits   ____________________________________________  EKG  none  ____________________________________________  RADIOLOGY  none  ____________________________________________   PROCEDURES  Procedure(s) performed: None Procedures Critical Care performed:  None ____________________________________________   INITIAL IMPRESSION / ASSESSMENT AND PLAN / ED COURSE   23 y.o. male with a history of testicular cancer status post left radical orchiectomy 6 days ago presents for evaluation of rash.  Patient has a pruritic erythematous blanching rash involved the prepped surgical area consistent with an allergic reaction from  possible solution use to prep the skin.  There is no evidence of cellulitis, crepitus, necrotizing fasciitis, no blisters, no tenderness. Surgical site looks well healing.  Labs with no acute findings.  Will start patient on Benadryl.  Has an appointment with Dr. Erlene Quan in 3 days.  Recommended keeping the appointment.  Discussed return precautions.    _________________________ 3:12 PM on 07/20/2018 -----------------------------------------  Discussed with Dr. Junious Silk from urology who recommended Benadryl follow-up with Dr. Erlene Quan.  Discussed return precautions with patient.   As part of my medical decision making, I reviewed the following data within the [place] notes reviewed and incorporated, Labs reviewed , Old chart reviewed, A consult was requested and obtained from this/these consultant(s) Urology, Notes from prior ED visits and  Controlled Substance Database    Pertinent labs & imaging results that were available during my care of the patient were reviewed by me and considered in my medical decision making (see chart for details).    ____________________________________________   FINAL CLINICAL IMPRESSION(S) / ED DIAGNOSES  Final diagnoses:  Allergic contact  dermatitis due to drugs in contact with skin      NEW MEDICATIONS STARTED DURING THIS VISIT:  ED Discharge Orders         Ordered    diphenhydrAMINE-zinc acetate (BENADRYL EXTRA STRENGTH) cream  3 times daily PRN     07/20/18 1509           Note:  This document was prepared using Dragon voice recognition software and may include unintentional dictation errors.    Alfred Levins, [place], MD 07/20/18 [phone]

## 2018-07-20 NOTE — ED Notes (Signed)
Pt given warm blanket for comfort

## 2018-07-20 NOTE — ED Triage Notes (Addendum)
Pt arrives to ED ambulating with cane.   Pt gave permission for friend to interpret, denied wanting ipad interpreter. Has been waiting 30 minutes for live interpreter.   Pt had surgery Monday and c/o of itching and redness "down there". Pt surgery was with Dr. Erlene Quan for testicular mass according to chart- left radical orchiectomy (inguinal approach). Denies urinary symptoms.

## 2018-07-23 ENCOUNTER — Ambulatory Visit: Payer: Self-pay | Admitting: Urology

## 2018-07-29 ENCOUNTER — Other Ambulatory Visit: Payer: Self-pay | Admitting: Pathology

## 2018-07-29 LAB — SURGICAL PATHOLOGY

## 2018-07-30 ENCOUNTER — Telehealth: Payer: Self-pay | Admitting: Urology

## 2018-07-30 ENCOUNTER — Ambulatory Visit
Admission: RE | Admit: 2018-07-30 | Discharge: 2018-07-30 | Disposition: A | Discharge status: Home / Self Care | Payer: Self-pay | Source: Ambulatory Visit | Attending: Urology | Admitting: Urology

## 2018-07-30 DIAGNOSIS — N5089 Other specified disorders of the male genital organs: Secondary | ICD-10-CM | POA: Insufficient documentation

## 2018-07-30 MED ORDER — IOHEXOL 300 MG/ML  SOLN
100.0000 mL | Freq: Once | INTRAMUSCULAR | Status: AC | PRN
  Administered 2018-07-30: 100 mL via INTRAVENOUS

## 2018-07-30 NOTE — Telephone Encounter (Signed)
Spoke with Andre Valdez through interpretive services regarding his CT results  He is coming in tomorrow at 9:45am to see me for an exam.  At the time of the phone call, he denied fevers, chills, pain, nausea/vomiting or drainage from the surgical site.  He is instructed to head to the ED if he should experience these symptoms during the night.    Please add him to my schedule for tomorrow for 9:45am.

## 2018-07-31 ENCOUNTER — Ambulatory Visit (INDEPENDENT_AMBULATORY_CARE_PROVIDER_SITE_OTHER): Payer: Self-pay | Admitting: Urology

## 2018-07-31 ENCOUNTER — Encounter: Payer: Self-pay | Admitting: Urology

## 2018-07-31 ENCOUNTER — Ambulatory Visit
Admission: RE | Admit: 2018-07-31 | Discharge: 2018-07-31 | Disposition: A | Discharge status: Home / Self Care | Payer: Self-pay | Attending: Urology | Admitting: Urology

## 2018-07-31 ENCOUNTER — Ambulatory Visit
Admission: RE | Admit: 2018-07-31 | Discharge: 2018-07-31 | Disposition: A | Discharge status: Home / Self Care | Payer: Self-pay | Source: Ambulatory Visit | Attending: Urology | Admitting: Urology

## 2018-07-31 VITALS — BP 130/77 | HR 71 | Ht 65.0 in | Wt 139.0 lb

## 2018-07-31 DIAGNOSIS — N5089 Other specified disorders of the male genital organs: Secondary | ICD-10-CM | POA: Insufficient documentation

## 2018-07-31 MED ORDER — DIPHENHYDRAMINE HCL 50 MG PO TABS: ORAL_TABLET | ORAL | 0 refills | Status: AC

## 2018-07-31 NOTE — Patient Instructions (Signed)
Patient needs to have his chest xray prior to his appointment with Dr. Erlene Quan

## 2018-07-31 NOTE — Progress Notes (Signed)
07/31/2018  10:41 AM   Andre Valdez Jan 04, 1996 [id number]  Referring provider: No referring provider defined for this encounter.  Chief Complaint  Patient presents with  . Testicular Mass   HPI: Andre Valdez is a 23 y.o.[place] male that presents today as a recheck after findings of a possible abscess seen on yesterday's CT with interpreter, Ronnald Collum.   He denies pain, swelling, fevers, and chils. He reports of itching  throughout the scrotum which started 3 days ago. He is unable to sleep at night due to the itching.   He has been using the Benadryl cream given at the ER which he discontinued due to developing a rash on his chest 10 days ago. He did not have itching when using the Benadryl cream. He took pain medicine 2 weeks ago. His back has not been itching. He is not taking any antibiotics.    He also denies any OTC medications or drug use since surgery.    Previous history: Patient presented to the emergency department on 07/09/2018 with complaints of bilateral burning testicular pain and progressive left testicular swelling over the past month.   Scrotal US w/doppler with diffusely enlarged and heterogeneous appearance of the left testis suspicious for left testicular mass, surgical consultation was recommended. Right testis with normal appearance, small hydrocele.  HCG from the ED was elevated.  WBC count 15.0.  Platelets 469.    He reports that at symptom onset, about a month ago, he noted swelling and persistent testicile burning pain on the left side, He denied any recent testicular injury. He has not been sexually active for the last 2-3 months.   Patient denies any allergies, blood disorders, past surgical history and/or medical conditions. Patient is not on any medications and has no personal history of allergies/complications with anesthesia. He reports he has no known familial allergies/complications with anesthesia  He reports that he has not noted any  lymphadenitis on his body. Although, he reports left abdominal pain started on 07/09/2018 .  No familial testicular cancer history. He has a child in [place].  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     PMH: Past Medical History:  Diagnosis Date  . 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)     Surgical History: NONE  Home Medications:  Allergies as of 07/31/2018   No Known Allergies     Medication List       Accurate as of July 31, 2018 10:41 AM. Always use your most recent med list.        diphenhydrAMINE 50 MG tablet Commonly known as:  BENADRYL Take one tablet every 6 hours as needed for itching   diphenhydrAMINE-zinc acetate cream Commonly known as:  BENADRYL EXTRA STRENGTH Apply 1 application topically 3 (three) times daily as needed for itching.   oxyCODONE-acetaminophen 5-325 MG tablet Commonly known as:  PERCOCET Take 1 tablet by mouth every 4 (four) hours as needed.      Allergies: No Known Allergies  Family History: Family History  Problem Relation Age of Onset  . Heart disease Neg Hx   . Sudden Cardiac Death Neg Hx     Social History:  reports that he quit smoking about 7 weeks ago. His smoking use included cigarettes. He has never used smokeless tobacco. He reports previous alcohol use. He reports previous drug use. Drugs: Codeine, Cocaine, and Marijuana.  ROS: UROLOGY Frequent Urination?: No Hard to postpone urination?: No  Burning/pain with urination?: No Get up at night to urinate?: No Leakage of urine?: No Urine stream starts and stops?: No Trouble starting stream?: No Do you have to strain to urinate?: No Blood in urine?: No Urinary tract infection?: No Sexually transmitted disease?: No Injury to kidneys or bladder?: No Painful intercourse?: No Weak stream?: No Erection problems?: No Penile pain?: No  Gastrointestinal Nausea?: No Vomiting?: No Indigestion/heartburn?:  No Diarrhea?: No Constipation?: No  Constitutional Fever: No Night sweats?: No Weight loss?: No Fatigue?: No  Skin Skin rash/lesions?: No Itching?: Yes  Eyes Blurred vision?: No Double vision?: No  Ears/Nose/Throat Sore throat?: No Sinus problems?: No  Hematologic/Lymphatic Swollen glands?: No Easy bruising?: No  Cardiovascular Leg swelling?: No Chest pain?: No  Respiratory Cough?: No Shortness of breath?: No  Endocrine Excessive thirst?: No  Musculoskeletal Back pain?: No Joint pain?: No  Neurological Headaches?: No Dizziness?: No  Psychologic Depression?: No Anxiety?: No  Physical Exam: BP 130/77 (BP Location: Left Arm, Patient Position: Sitting)   Pulse 71   Ht 5\' 5"  (1.651 m)   Wt 139 lb (63 kg)   BMI 23.13 kg/m   Constitutional:  Well nourished. Alert and oriented, No acute distress. HEENT: [place] AT, moist mucus membranes.  Trachea midline, no masses. Cardiovascular: No clubbing, cyanosis, or edema. Respiratory: Normal respiratory effort, no increased work of breathing.    GU: No CVA tenderness. No bladder fullness or masses.  Patient with uncircumcised phallus. Foreskin easily retracted.  Urethral meatus is patent.  No penile discharge. No penile lesions or rashes.  Right testicle is located scrotally.  Left cord remnant indurated, but no crepitus, no tenderness with palpation, no erythema or drainage.  Scrotum without lesions, cysts, rashes, edema, erythema, crepitus, fluctuance or drainage upon palpation.  Skin: No rashes, bruises or suspicious lesions. Macule papule rash on chest and back.  Neurologic: Grossly intact, no focal deficits, moving all 4 extremities. Psychiatric: Normal mood and affect.  Pertinent Imaging: CLINICAL DATA:  Testicular cancer. Status post left orchiectomy 16 days ago.  EXAM: CT ABDOMEN AND PELVIS WITH CONTRAST  TECHNIQUE: Multidetector CT imaging of the abdomen and pelvis was performed using the standard  protocol following bolus administration of intravenous contrast.  CONTRAST:  184mL OMNIPAQUE IOHEXOL 300 MG/ML  SOLN  COMPARISON:  None.  FINDINGS: Lower chest: Unremarkable  Hepatobiliary: No suspicious focal abnormality within the liver parenchyma. There is no evidence for gallstones, gallbladder wall thickening, or pericholecystic fluid. No intrahepatic or extrahepatic biliary dilation.  Pancreas: No focal mass lesion. No dilatation of the main duct. No intraparenchymal cyst. No peripancreatic edema.  Spleen: No splenomegaly. No focal mass lesion.  Adrenals/Urinary Tract: No adrenal nodule or mass. Kidneys unremarkable. No evidence for hydroureter. The urinary bladder appears normal for the degree of distention.  Stomach/Bowel: Stomach is unremarkable. No gastric wall thickening. No evidence of outlet obstruction. Duodenum is normally positioned as is the ligament of Treitz. No small bowel wall thickening. No small bowel dilatation. The terminal ileum is normal. The appendix is normal. No gross colonic mass. No colonic wall thickening.  Vascular/Lymphatic: No abdominal aortic aneurysm. No abdominal aortic atherosclerotic calcification. There is no gastrohepatic or hepatoduodenal ligament lymphadenopathy. No intraperitoneal or retroperitoneal lymphadenopathy. No pelvic sidewall lymphadenopathy.  Reproductive: The prostate gland and seminal vesicles are unremarkable.  Other: No intraperitoneal free fluid.  Musculoskeletal: Edema in the left groin is compatible with recent orchiectomy. Small gas bubble in this region noted and residual soft tissue gas from surgery is not expected  outside of 7-10 days after surgery. Small rim enhancing fluid collection in the left hemiscrotum may be related to a small hematoma.  IMPRESSION: Edema/inflammation in the left groin is compatible with the history of surgery on 07/14/2018. There is a small gas bubble in the  region of the left inguinal canal and residual gas after surgery is not expected beyond more than about postoperative day 7-10. Small rim enhancing fluid collection is associated in the left hemiscrotum. This may represent a small postoperative hematoma, but evolving abscess in the left hemiscrotum can not be excluded by imaging.  No evidence for metastatic disease in the abdomen or pelvis.  These results will be called to the ordering clinician or representative by the Radiologist Assistant, and communication documented in the PACS or zVision Dashboard.   Electronically Signed   By: Misty Stanley M.D.   On: 07/30/2018 14:57  I have personally reviewed the images with Dr. Diamantina Providence and agree with radiologist interpretation.   Assessment & Plan:    1. Testicular Mass  - Underwent orchietectomy on 07/14/2018   - Patient denies any allergies, medication usage, familial or personal anesthetic problems, blood disorders, past surgical history and/or medical conditions.  -CT of abdomen showed edema/inflammation in the left groin compatible with the history of surgery on 07/31/2018. Small rim enhancing fluid collection is associated in the left hemiscrotum. This may represent a small postoperative hematoma, but evolving abscess in the left hemiscrotum can not be excluded by imaging. No evidence for metastatic disease in the abdomen or pelvis.   -Reports of scrotal itching onset 3 days ago, Benadryl discontinued  -?itching may can be allergic reaction to sutures   -Written Rx of Benadryl given to relieve itching   -Keep f/u with Dr. Erlene Quan on August 05, 2018   - needs to get chest x-ray  - reviewed red flag symptoms (swelling, intense pain, or drainage in the scrotum, fever, chills, vomiting, etc.) and instructed to seek treatment in the ED immediately if he should experience any of these symptoms   2. History of drug use  - patient denies any recent drug use   Return for keep appointment  with brandon on 08/04/2018 .  Zara Council, PA-C [facility] [address] [place], [place] [address] [phone]  I, Lucas Mallow, am acting as a Education administrator for [employer],  I have reviewed the above documentation for accuracy and completeness, and I agree with the above.    Zara Council, PA-C

## 2018-08-05 ENCOUNTER — Ambulatory Visit (INDEPENDENT_AMBULATORY_CARE_PROVIDER_SITE_OTHER): Payer: Self-pay | Admitting: Urology

## 2018-08-05 ENCOUNTER — Encounter: Payer: Self-pay | Admitting: Urology

## 2018-08-05 VITALS — BP 124/82 | HR 87 | Ht 65.0 in | Wt 141.0 lb

## 2018-08-05 DIAGNOSIS — C6212 Malignant neoplasm of descended left testis: Secondary | ICD-10-CM

## 2018-08-05 NOTE — Progress Notes (Signed)
08/05/2018 4:57 PM   Mansura 10/30/1995 [id number]  Referring provider: No referring provider defined for this encounter.  Chief Complaint  Patient presents with  . Routine Post Op    HPI: Andre Valdez is a 23 yo M returns today for a post-op orchiectomy f/u. He is accompanied by his friend.   He presented to the ED on 07/09/2018 with complaints of bilateral burning testicular pain and progressive left testicular swelling for about a month.   Scrotal US w/doppler with diffusely enlarged and heterogeneous appearance of the left testis suspicious for left testicular mass, surgical consultation was recommended. Right testis with normal appearance, small hydrocele.   He underwent a orchiectomy on 07/14/2018.   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  Maximal tumor size 8.7 cm.  LVH present.  Tumor confined to testicle.  pT2NX.  Since then, he is undergone CT abdomen pelvis on 07/30/2018 which showed findings consistent with postoperative changes including edema, inflammation in the left groin, some residual air following surgery and a small rim-enhancing fluid collection in the left hemiscrotum likely representing hematoma.  Chest x-ray was unremarkable.  Preoperative tumor markers including beta hCG and AFP were both elevated.   He returned to the office on 07/31/2018 with [facility] for a recheck after findings of abscess seen on his CT from 07/30/2018.   Today, has burning pain in his inner thigh on his left leg.  Otherwise he has no complaints.  His incision is healing well.  He is anxious to get back to work.   PMH: Past Medical History:  Diagnosis Date  . Testicular cancer [facility])     Surgical History: Past Surgical History:  Procedure Laterality Date  . ORCHIECTOMY Left 07/14/2018    Procedure: ORCHIECTOMY;  Surgeon: Hollice Espy, MD;  Location: [facility] ORS;  Service: Urology;  Laterality: Left;    Home Medications:  Allergies as of 08/05/2018   No Known Allergies     Medication List       Accurate as of August 05, 2018  4:57 PM. Always use your most recent med list.        diphenhydrAMINE 50 MG tablet Commonly known as:  BENADRYL Take one tablet every 6 hours as needed for itching   diphenhydrAMINE-zinc acetate cream Commonly known as:  BENADRYL EXTRA STRENGTH Apply 1 application topically 3 (three) times daily as needed for itching.   oxyCODONE-acetaminophen 5-325 MG tablet Commonly known as:  PERCOCET Take 1 tablet by mouth every 4 (four) hours as needed.       Allergies: No Known Allergies  Family History: Family History  Problem Relation Age of Onset  . Heart disease Neg Hx   . Sudden Cardiac Death Neg Hx     Social History:  reports that he quit smoking about 8 weeks ago. His smoking use included cigarettes. He has never used smokeless tobacco. He reports previous alcohol use. He reports previous drug use. Drugs: Codeine, Cocaine, and Marijuana.  ROS: UROLOGY Frequent Urination?: No Hard to postpone urination?: No Burning/pain with urination?: No Get up at night to urinate?: No Leakage of urine?: No Urine stream starts and stops?: No Trouble starting stream?: No Do you have to strain to urinate?: No Blood in urine?: No Urinary tract infection?: No Sexually transmitted disease?: No Injury to kidneys or bladder?: No Painful intercourse?:  No Weak stream?: No Erection problems?: No Penile pain?: No  Gastrointestinal Nausea?: No Vomiting?: No Indigestion/heartburn?: No Diarrhea?: No Constipation?: No  Constitutional Fever: No Night sweats?: No Weight loss?: No Fatigue?: No  Skin Skin rash/lesions?: No Itching?: No  Eyes Blurred vision?: No Double vision?: No  Ears/Nose/Throat Sore throat?: No Sinus problems?:  No  Hematologic/Lymphatic Swollen glands?: No Easy bruising?: No  Cardiovascular Leg swelling?: No Chest pain?: No  Respiratory Cough?: No Shortness of breath?: No  Endocrine Excessive thirst?: No  Musculoskeletal Back pain?: No Joint pain?: No  Neurological Headaches?: No Dizziness?: No  Psychologic Depression?: No Anxiety?: No  Physical Exam: BP 124/82 (BP Location: Left Arm, Patient Position: Sitting)   Pulse 87   Ht 5\' 5"  (1.651 m)   Wt 141 lb (64 kg)   BMI 23.46 kg/m   Constitutional:  Alert and oriented, No acute distress. HEENT: [place] AT, moist mucus membranes.  Trachea midline, no masses. Cardiovascular: No clubbing, cyanosis, or edema. Respiratory: Normal respiratory effort, no increased work of breathing. GU: Small hematoma within the left hemiscrotum, nontender without scrotal skin changes or edema.  Left subinguinal incision healing well.  Points to left inner thigh is location no numbness.  No weakness appreciated. Skin: No rashes, bruises or suspicious lesions. Neurologic: Grossly intact, no focal deficits, moving all 4 extremities. Psychiatric: Normal mood and affect.  Pertinent Imaging: CLINICAL DATA:  Testicular cancer. Status post left orchiectomy 16 days ago.  EXAM: CT ABDOMEN AND PELVIS WITH CONTRAST  TECHNIQUE: Multidetector CT imaging of the abdomen and pelvis was performed using the standard protocol following bolus administration of intravenous contrast.  CONTRAST:  131mL OMNIPAQUE IOHEXOL 300 MG/ML  SOLN  COMPARISON:  None.  FINDINGS: Lower chest: Unremarkable  Hepatobiliary: No suspicious focal abnormality within the liver parenchyma. There is no evidence for gallstones, gallbladder wall thickening, or pericholecystic fluid. No intrahepatic or extrahepatic biliary dilation.  Pancreas: No focal mass lesion. No dilatation of the main duct. No intraparenchymal cyst. No peripancreatic edema.  Spleen: No splenomegaly.  No focal mass lesion.  Adrenals/Urinary Tract: No adrenal nodule or mass. Kidneys unremarkable. No evidence for hydroureter. The urinary bladder appears normal for the degree of distention.  Stomach/Bowel: Stomach is unremarkable. No gastric wall thickening. No evidence of outlet obstruction. Duodenum is normally positioned as is the ligament of Treitz. No small bowel wall thickening. No small bowel dilatation. The terminal ileum is normal. The appendix is normal. No gross colonic mass. No colonic wall thickening.  Vascular/Lymphatic: No abdominal aortic aneurysm. No abdominal aortic atherosclerotic calcification. There is no gastrohepatic or hepatoduodenal ligament lymphadenopathy. No intraperitoneal or retroperitoneal lymphadenopathy. No pelvic sidewall lymphadenopathy.  Reproductive: The prostate gland and seminal vesicles are unremarkable.  Other: No intraperitoneal free fluid.  Musculoskeletal: Edema in the left groin is compatible with recent orchiectomy. Small gas bubble in this region noted and residual soft tissue gas from surgery is not expected outside of 7-10 days after surgery. Small rim enhancing fluid collection in the left hemiscrotum may be related to a small hematoma.  IMPRESSION: Edema/inflammation in the left groin is compatible with the history of surgery on 07/14/2018. There is a small gas bubble in the region of the left inguinal canal and residual gas after surgery is not expected beyond more than about postoperative day 7-10. Small rim enhancing fluid collection is associated in the left hemiscrotum. This may represent a small postoperative hematoma, but evolving abscess in the left hemiscrotum can not be excluded by imaging.  No  evidence for metastatic disease in the abdomen or pelvis.  These results will be called to the ordering clinician or representative by the Radiologist Assistant, and communication documented in the PACS or zVision  Dashboard.   Electronically Signed   By: Misty Stanley M.D.   On: 07/30/2018 14:57  CLINICAL DATA:  23 year old male with testicular cancer.  EXAM: CHEST - 2 VIEW  COMPARISON:  None.  FINDINGS: The cardiomediastinal silhouette is unremarkable.  There is no evidence of focal airspace disease, pulmonary edema, suspicious pulmonary nodule/mass, pleural effusion, or pneumothorax.  No acute bony abnormalities are identified.  IMPRESSION: No active cardiopulmonary disease.   Electronically Signed   By: Margarette Canada M.D.   On: 07/31/2018 16:20   I have personally reviewed the images and agree with radiologist interpretation.   Assessment & Plan:    1. 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  We discussed NCCN guidelines for this.  Given the fairly unusual pathology, recommned referral him to [facility] for second opinion.   Recommend regular self testicular exams on contralateral testicle, monthly   Burning sensation in his inner thigh from inflammation after surgery, should resolve with time; Advised pt to OTC anti inflammatory ibuprofen 600 mg 3x which may help with inflammation around the irritated nerve (suspect injury, trauma, or irritation of cutaneous branch of ilioinguinal nerve)  Pt is agreeable to go to [facility], [place] [address] [place], [place] [address] [phone]  I, Lucas Mallow, am acting as a scribe for Dr. Hollice Espy,  I have reviewed the above documentation for accuracy and completeness, and I agree with the above.   Hollice Espy,  MD

## 2018-08-06 LAB — AFP TUMOR MARKER: AFP, Serum, Tumor Marker: 35.9 ng/mL — ABNORMAL HIGH (ref 0.0–8.3)

## 2018-08-06 LAB — BETA HCG QUANT (REF LAB): hCG Quant: <1 m[IU]/mL (ref 0–3)

## 2018-08-07 ENCOUNTER — Telehealth: Payer: Self-pay

## 2018-08-07 ENCOUNTER — Other Ambulatory Visit: Payer: Self-pay

## 2018-08-07 NOTE — Telephone Encounter (Signed)
-----   Message from Hollice Espy, MD sent at 08/06/2018  2:39 PM EST ----- Tumor markers are almost back to baseline.  He can have these repeated when he is seen and evaluated at [facility] to ensure that they are completely back to normal.  Hollice Espy, MD

## 2018-08-07 NOTE — Progress Notes (Signed)
Tumor Board Documentation  Andre Valdez was presented by Dr Erlene Quan at our Tumor Board on 08/07/2018, which included representatives from medical oncology, radiation oncology, surgical, radiology, pathology, navigation, internal medicine, research, pulmonology.  Andre Valdez currently presents as an external consult, for discussion, for new positive pathology with history of the following treatments: surgical intervention(s).  Additionally, we reviewed previous medical and familial history, history of present illness, and recent lab results along with all available histopathologic and imaging studies. The tumor board considered available treatment options and made the following recommendations: Active surveillance Refer to [facility] for second opion  The following procedures/referrals were also placed: No orders of the defined types were placed in this encounter.   Clinical Trial Status: not discussed   Staging used: AJCC Stage Group  AJCC Staging: T: pT2 N: Nx   Group: Stage 1  National site-specific guidelines NCCN were discussed with respect to the case.  Tumor board is a meeting of clinicians from various specialty areas who evaluate and discuss patients for whom a multidisciplinary approach is being considered. Final determinations in the plan of care are those of the provider(s). The responsibility for follow up of recommendations given during tumor board is that of the provider.   Today's extended care, comprehensive team conference, Andre Valdez was not present for the discussion and was not examined.   Multidisciplinary Tumor Board is a multidisciplinary case peer review process.  Decisions discussed in the Multidisciplinary Tumor Board reflect the opinions of the specialists present at the conference without having examined the patient.  Ultimately, treatment and diagnostic decisions rest with the primary provider(s) and the patient.

## 2018-08-08 NOTE — Telephone Encounter (Signed)
Interpreter ID# [id number]  Detailed message left on voicemail per [facility]

## 2018-08-13 ENCOUNTER — Ambulatory Visit (INDEPENDENT_AMBULATORY_CARE_PROVIDER_SITE_OTHER): Payer: Self-pay

## 2018-08-13 ENCOUNTER — Other Ambulatory Visit: Payer: Self-pay

## 2018-08-13 DIAGNOSIS — R0602 Shortness of breath: Secondary | ICD-10-CM

## 2019-07-22 IMAGING — US US SCROTUM W/ DOPPLER COMPLETE
1 series · 13 of 25 positions shown · non-contrast
Comparison: None.

CLINICAL DATA: Bilateral testicular pain for 1 month. Left
testicular swelling for 1 week.

EXAM:
SCROTAL ULTRASOUND
DOPPLER ULTRASOUND OF THE TESTICLES
TECHNIQUE: Complete ultrasound examination of the testicles, epididymis, and
other scrotal structures was performed. Color and spectral Doppler
ultrasound were also utilized to evaluate blood flow to the
testicles.

[Series 1: us scrotum w/ doppler complete · 0.13mm/px · 13 of 63 slices shown]
[im 1/63]
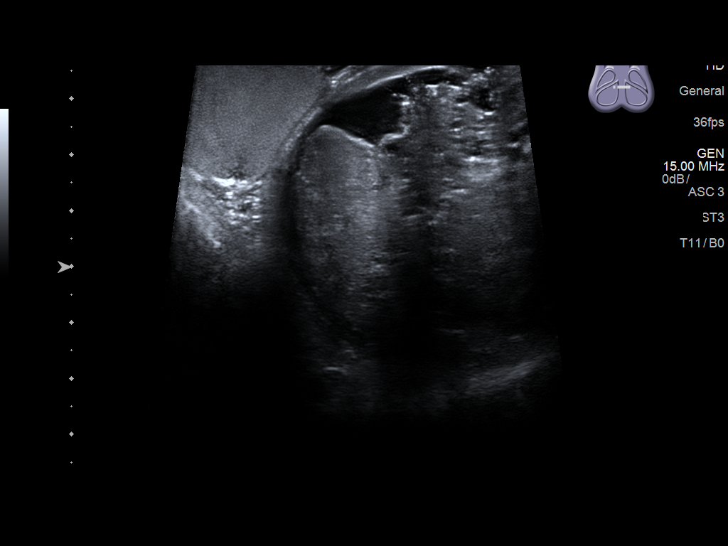
[im 6/63]
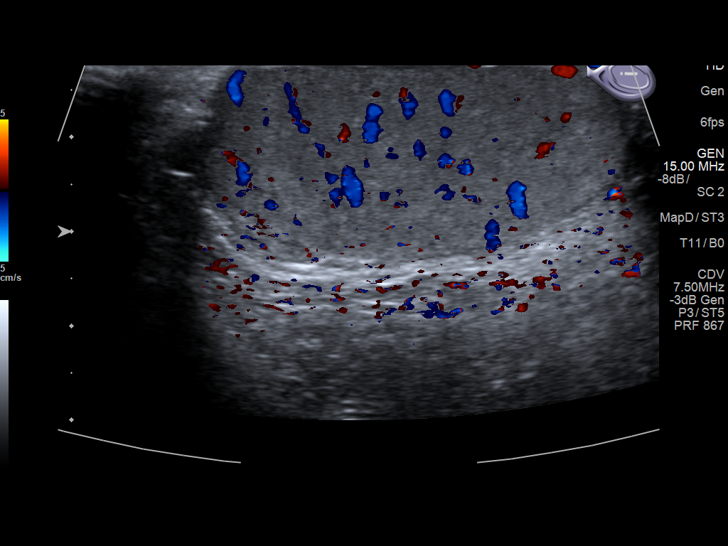
[im 11/63]
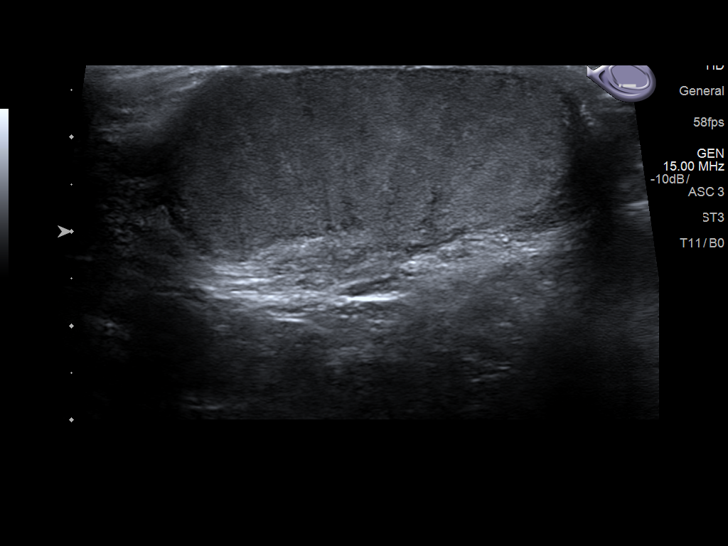
[im 16/63]
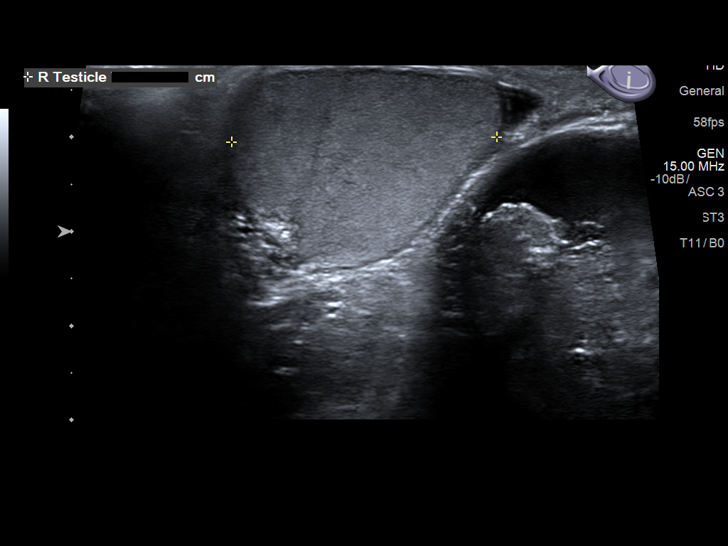
[im 21/63]
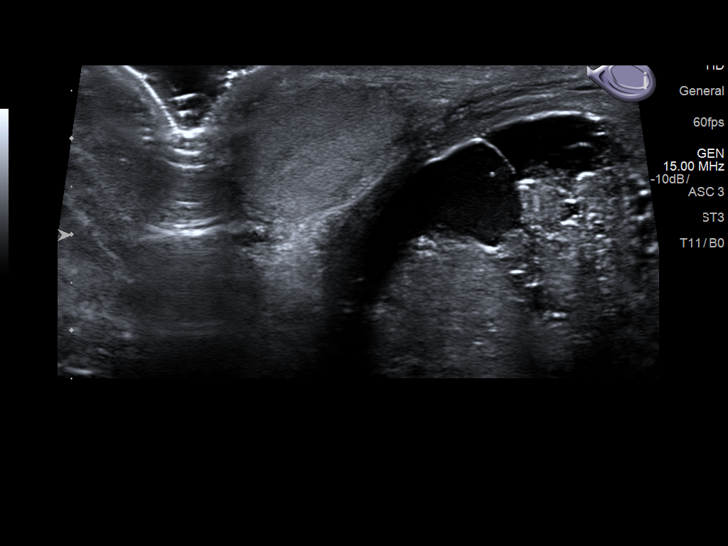
[im 26/63]
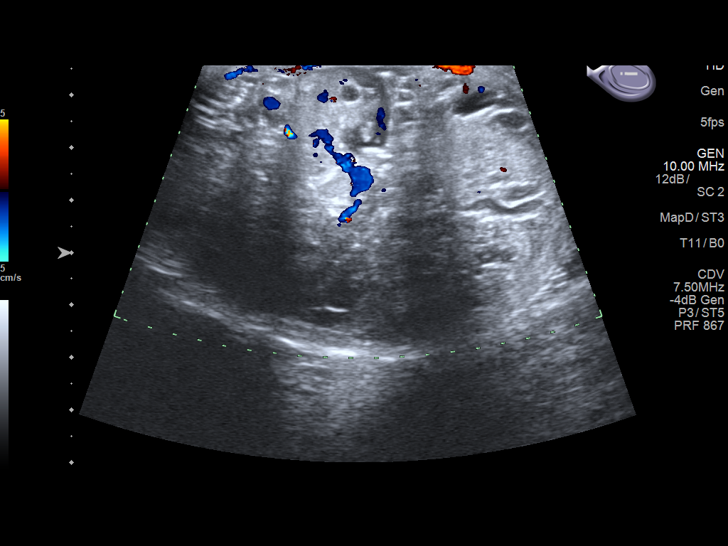
[im 32/63]
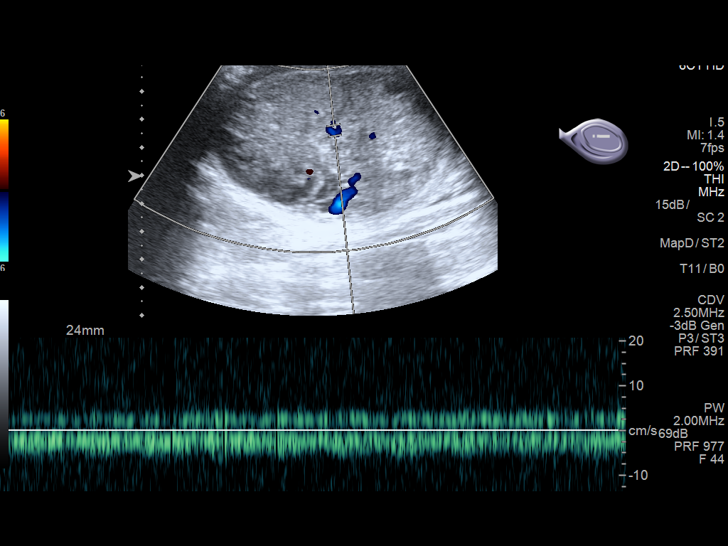
[im 37/63]
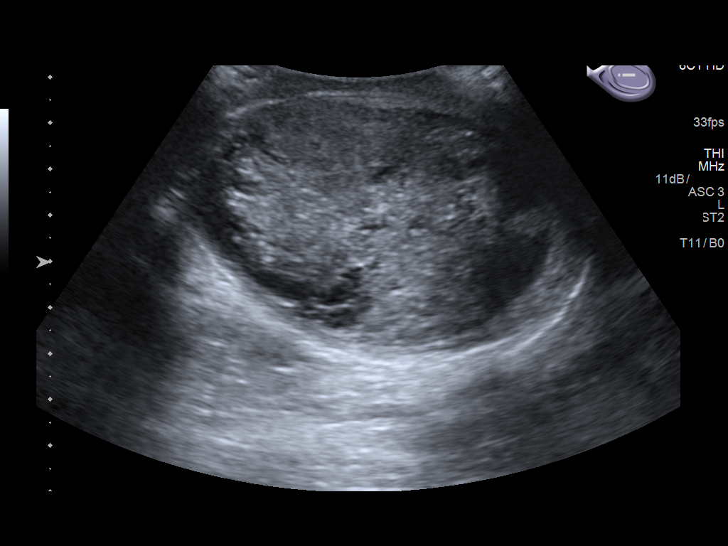
[im 42/63]
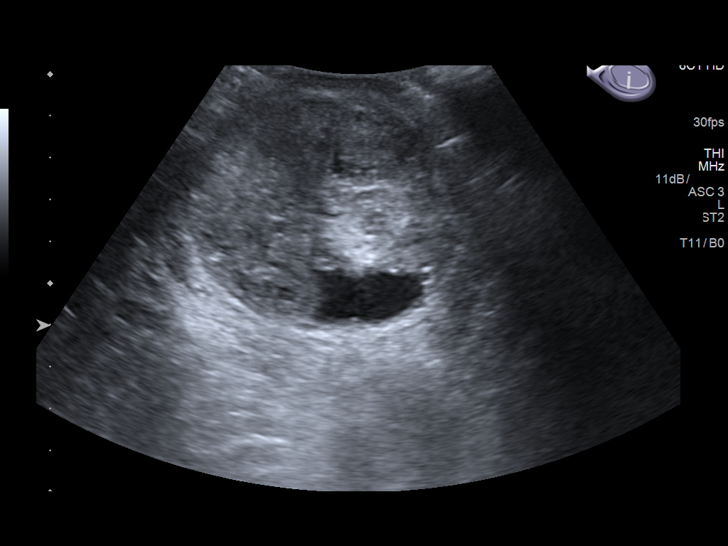
[im 47/63]
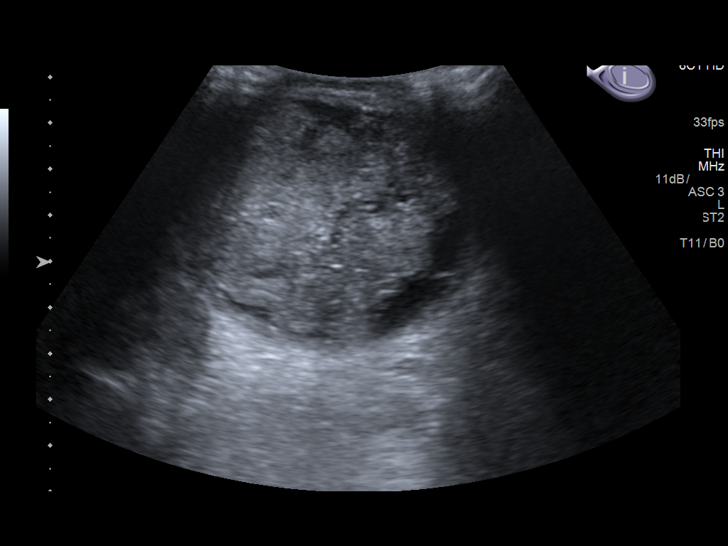
[im 52/63]
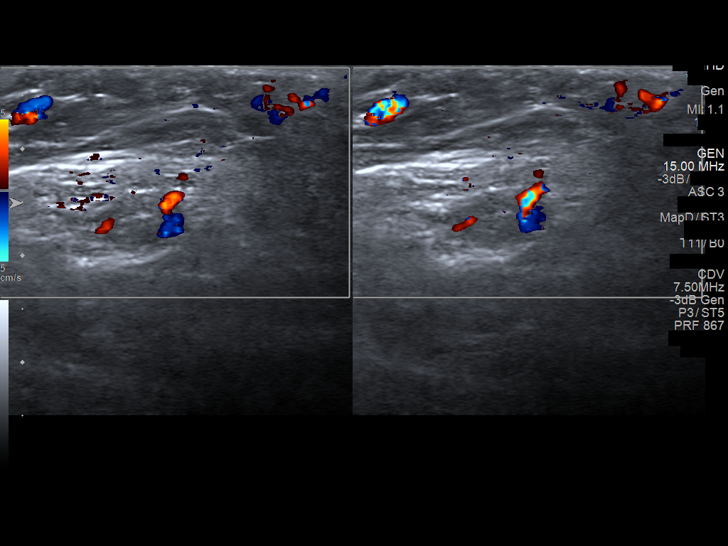
[im 57/63]
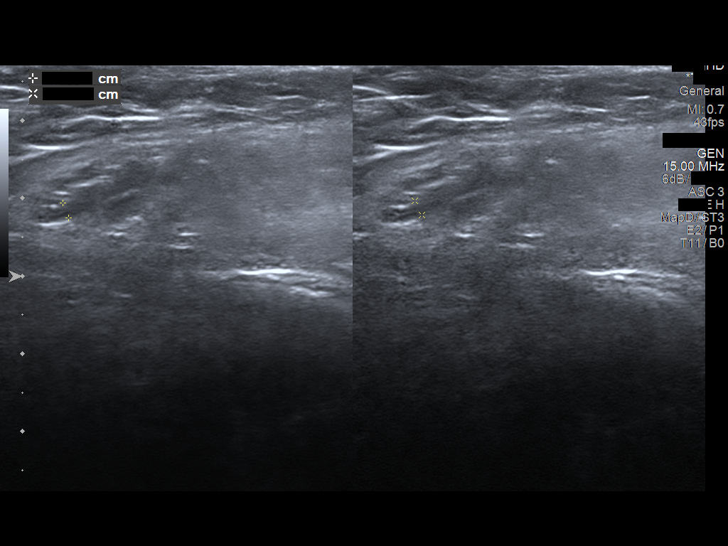
[im 63/63]
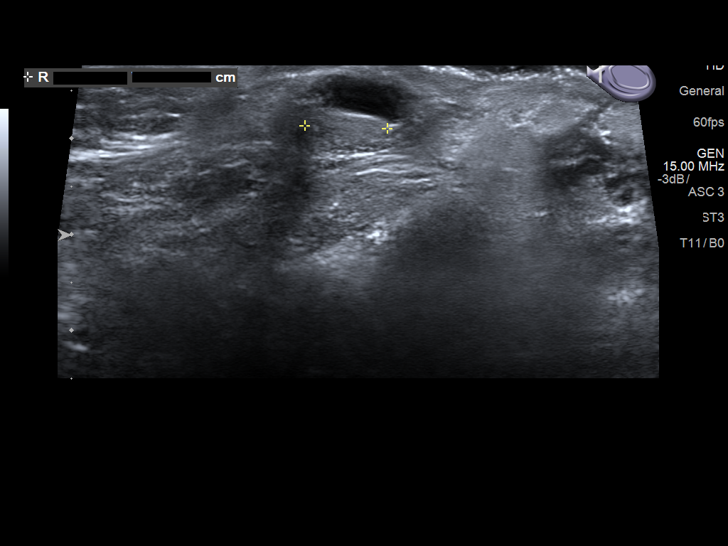

[13 of 25 positions shown; findings below may reference images not displayed]

FINDINGS: Right testicle

Measurements: 4.4 x 2.1 x 2.8 cm. No mass or microlithiasis
visualized.

Left testicle

Measurements: 8.7 x 5.7 x 7.1 cm. The left testis is diffusely
enlarged with heterogeneous appearance. Increased flow throughout
the left testis on color flow Doppler imaging. Changes are
suspicious for left testicular mass. Testicular rupture with
hematoma and infection would be a less likely consideration.

Right epididymis:  Normal in size and appearance.

Left epididymis:  Not visualized.

Hydrocele:  A small right hydrocele is present.

Varicocele:  None visualized.

Pulsed Doppler interrogation of both testes demonstrates normal low
resistance arterial and venous waveforms bilaterally. Abnormal
vascularity is demonstrated within the left testis.
IMPRESSION: 1. Diffusely enlarged and heterogeneous appearance of the left
testis suspicious for left testicular mass. Surgical consultation is
recommended.
2. Right testis has normal appearance.  Small right hydrocele.

## 2020-09-20 IMAGING — CR DG CHEST 2V
1 series · 2 of 2 positions shown · non-contrast
Comparison: None.

CLINICAL DATA: 22-year-old male with testicular cancer.

EXAM:
CHEST - 2 VIEW

[Series 1: w chest pa · 0.14mm/px · 2 of 2 slices shown]
[im 1/2]
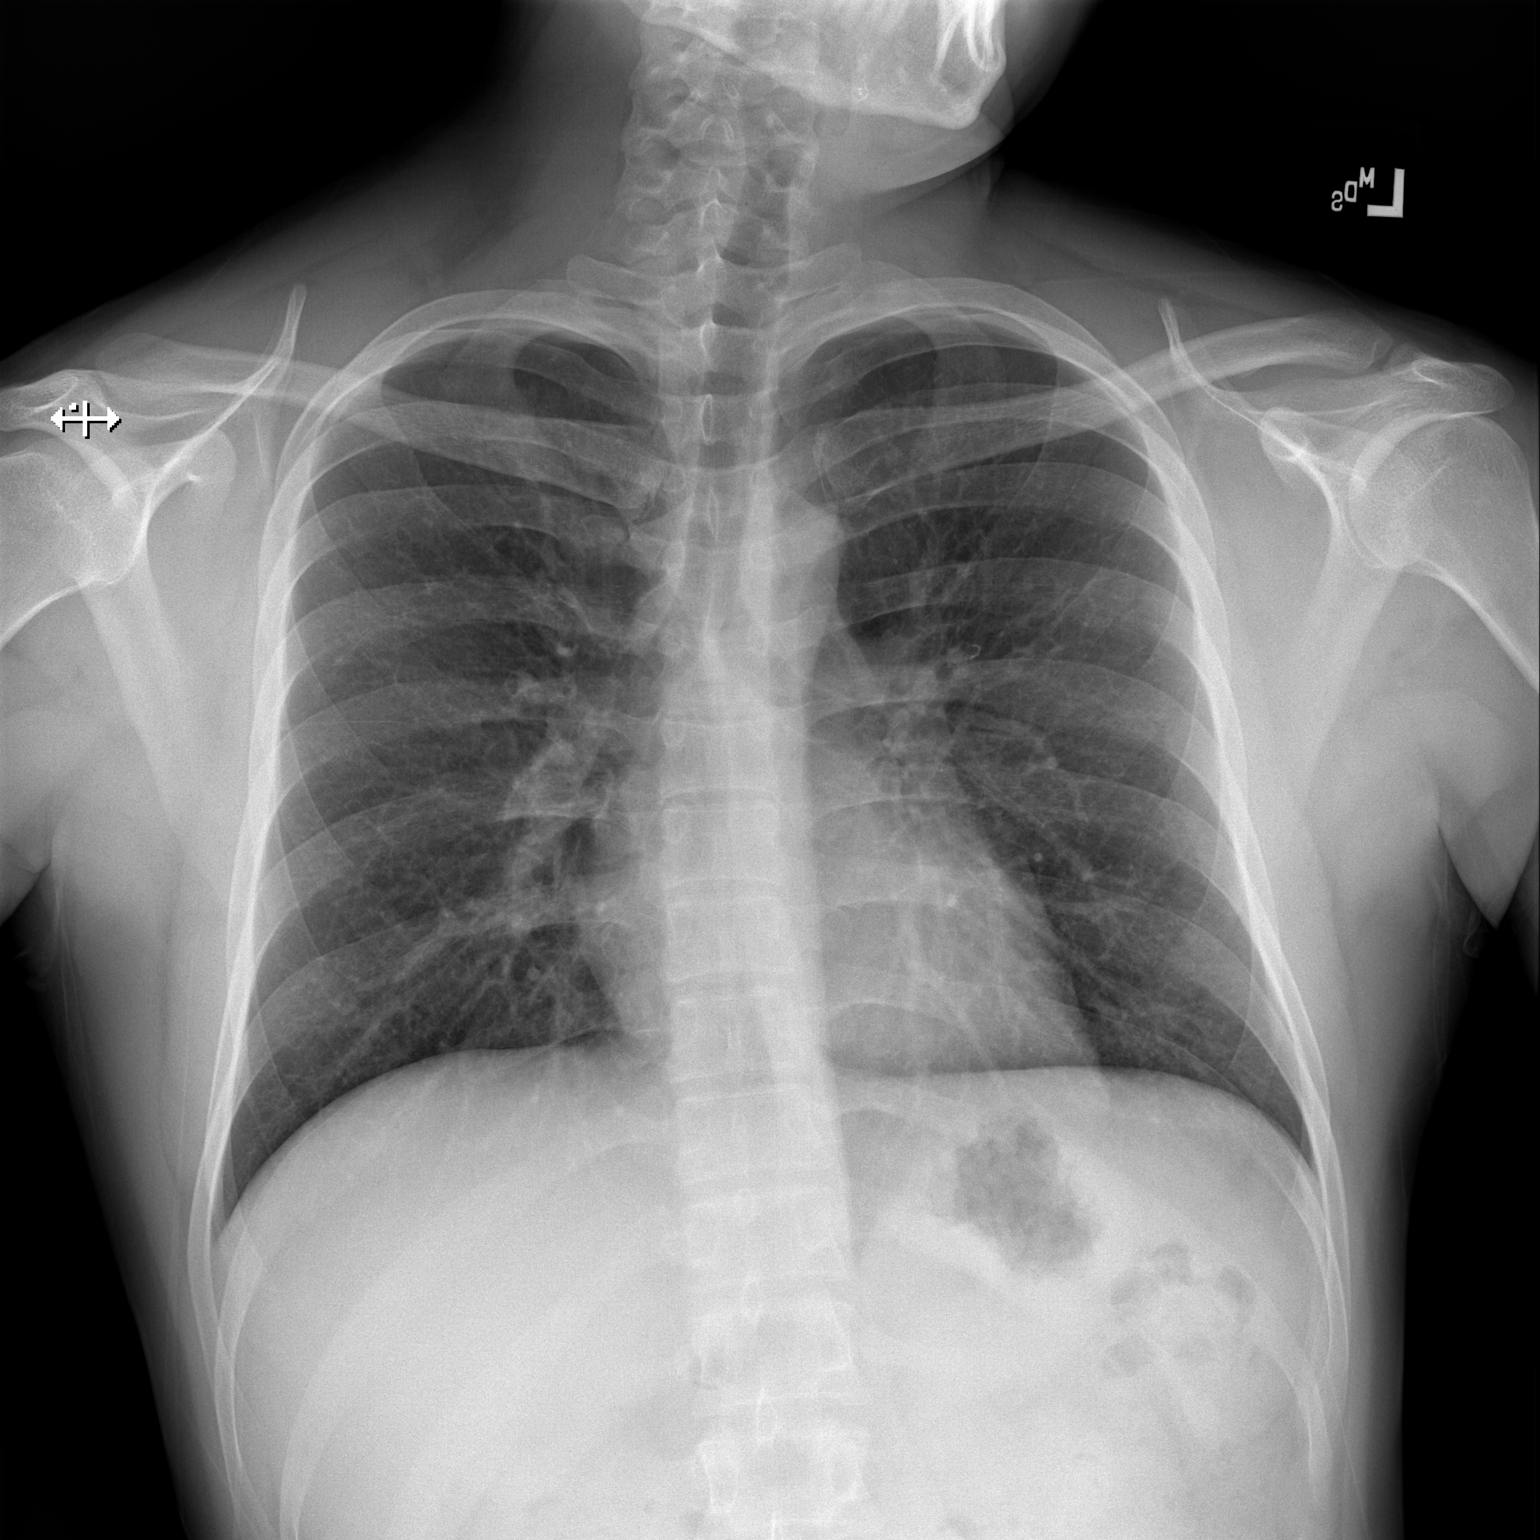
[im 2/2]
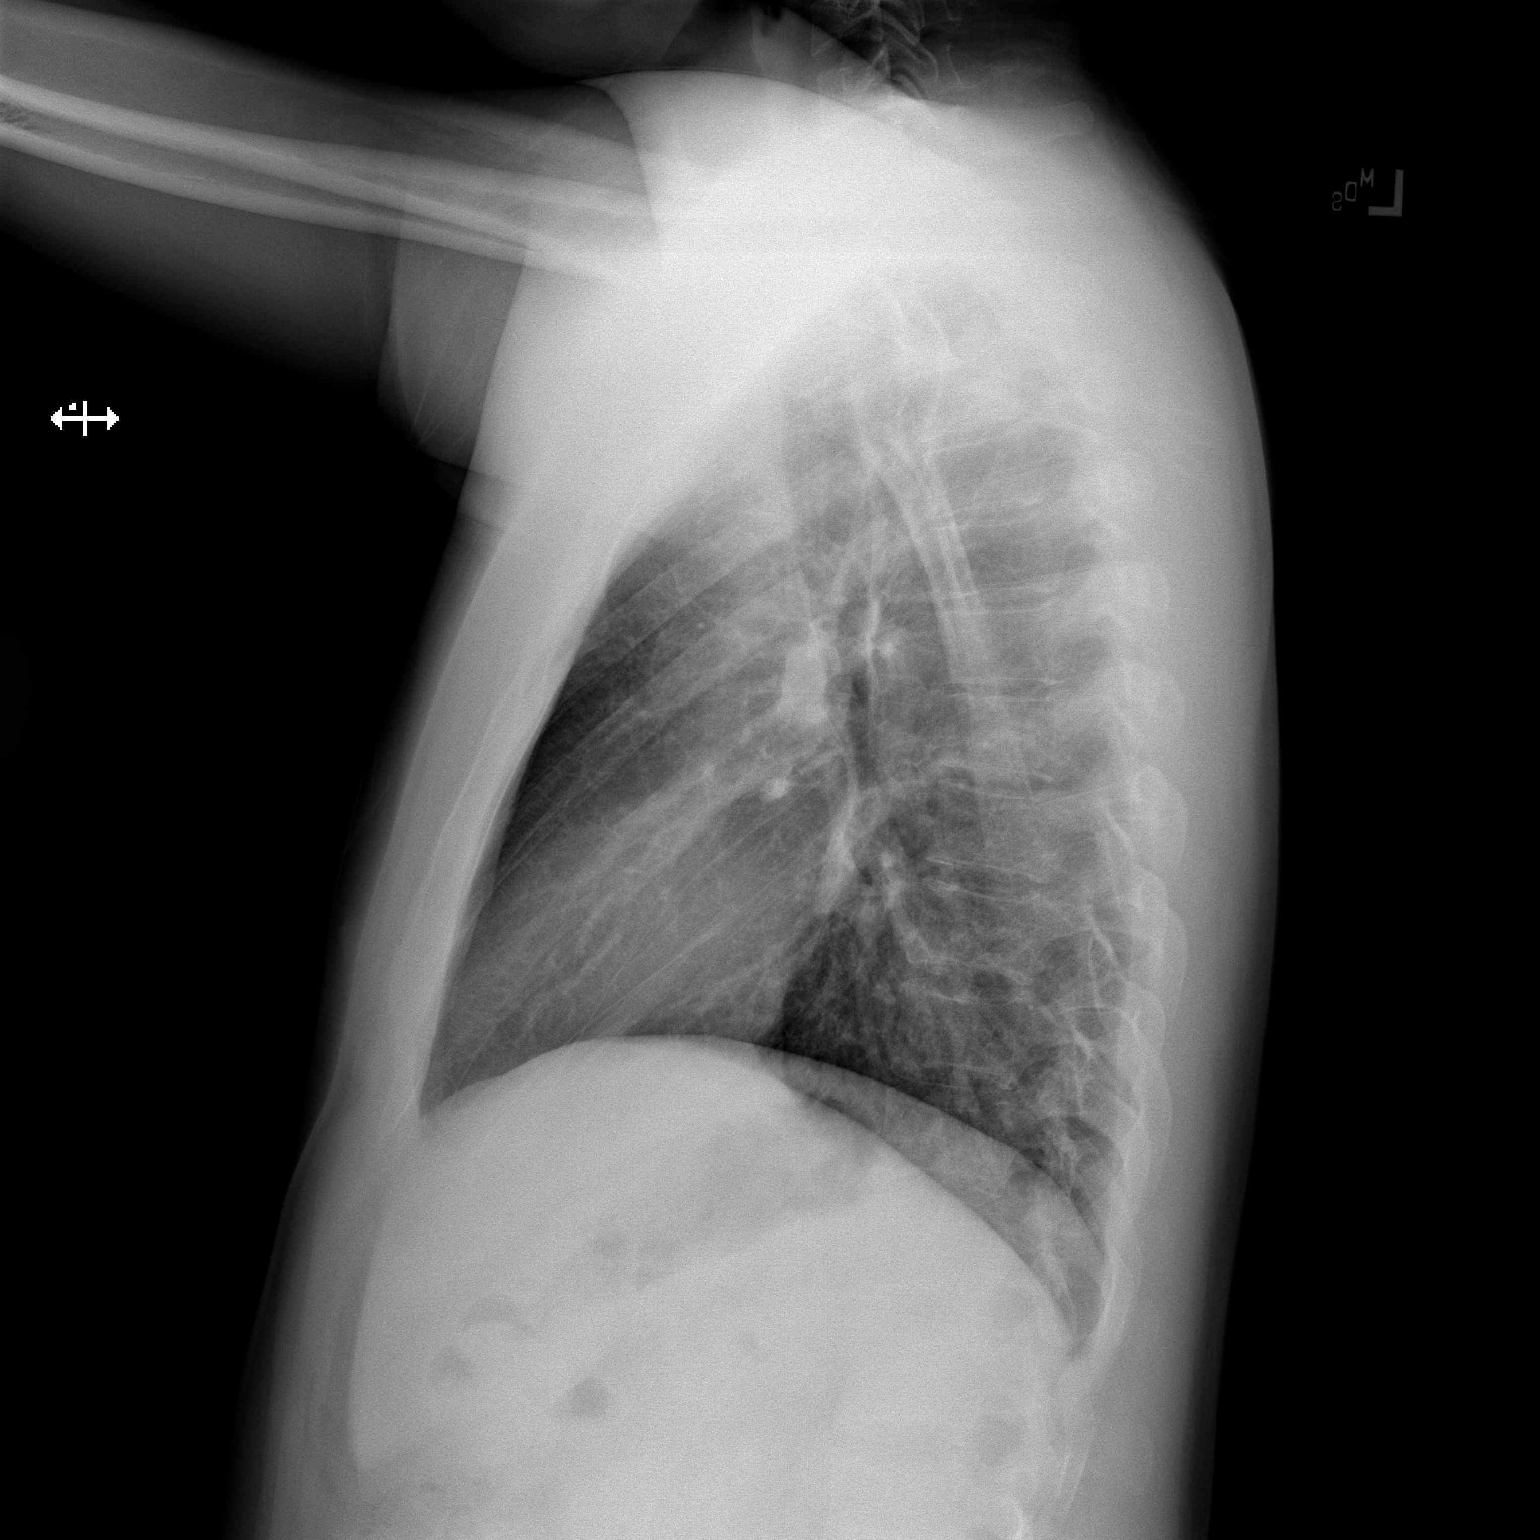

[2 of 2 positions shown; findings below may reference images not displayed]

FINDINGS: The cardiomediastinal silhouette is unremarkable.

There is no evidence of focal airspace disease, pulmonary edema,
suspicious pulmonary nodule/mass, pleural effusion, or pneumothorax.

No acute bony abnormalities are identified.
IMPRESSION: No active cardiopulmonary disease.

## 2020-11-14 ENCOUNTER — Ambulatory Visit: Payer: Self-pay

## 2020-11-14 ENCOUNTER — Other Ambulatory Visit: Payer: Self-pay | Admitting: Chiropractic Medicine

## 2020-11-14 ENCOUNTER — Other Ambulatory Visit: Payer: Self-pay

## 2020-11-14 DIAGNOSIS — M546 Pain in thoracic spine: Secondary | ICD-10-CM

## 2020-11-14 DIAGNOSIS — M545 Low back pain, unspecified: Secondary | ICD-10-CM

## 2022-04-16 LAB — HM HIV SCREENING LAB: HM HIV Screening: NEGATIVE

## 2022-04-16 LAB — HM HEPATITIS C SCREENING LAB: HM Hepatitis Screen: NEGATIVE

## 2022-04-16 LAB — HEPATITIS B SURFACE ANTIGEN

## 2022-04-17 ENCOUNTER — Ambulatory Visit: Payer: Self-pay | Admitting: Nurse Practitioner

## 2022-04-17 DIAGNOSIS — Z113 Encounter for screening for infections with a predominantly sexual mode of transmission: Secondary | ICD-10-CM

## 2022-04-17 DIAGNOSIS — C6290 Malignant neoplasm of unspecified testis, unspecified whether descended or undescended: Secondary | ICD-10-CM | POA: Insufficient documentation

## 2022-04-17 NOTE — Progress Notes (Signed)
[facility] Department STI clinic/screening visit  Subjective:  Andre Valdez is a 26 y.o. male being seen today for an STI screening visit. The patient reports they do not have symptoms.    Patient has the following medical conditions:   Patient Active Problem List   Diagnosis Date Noted   Testicular cancer ([place]) 04/17/2022   Abnormal EKG 07/18/2018   SOB (shortness of breath) 07/18/2018   Atypical chest pain 07/18/2018   Polysubstance abuse ([place]) 07/18/2018     Chief Complaint  Patient presents with   SEXUALLY TRANSMITTED DISEASE    Test of cure    HPI  Patient reports to clinic today for STD screening.  Patient reports that he is asymptomatic.   Last HIV test per patient/review of record was: unsure   Does the patient or their partner desires a pregnancy in the next year? Yes  Screening for MPX risk: Does the patient have an unexplained rash? No Is the patient MSM? No Does the patient endorse multiple sex partners or anonymous sex partners? No Did the patient have close or sexual contact with a person diagnosed with MPX? No Has the patient traveled outside the [place] where MPX is endemic? No Is there a high clinical suspicion for MPX-- evidenced by one of the following No  -Unlikely to be chickenpox  -Lymphadenopathy  -Rash that present in same phase of evolution on any given body part   See flowsheet for further details and programmatic requirements.   Immunization History  Administered Date(s) Administered   Hepatitis B, PED/ADOLESCENT 07/15/2003, 08/18/2003, 11/29/2003   MMR 09/03/1997, 10/19/2000   Tdap 01/24/2009     The following portions of the patient's history were reviewed and updated as appropriate: allergies, current medications, past medical history, past social history, past surgical history and problem list.  Objective:  There were no vitals filed for this visit.  Physical Exam Constitutional:      Appearance: Normal  appearance.  HENT:     Head: Normocephalic. No abrasion, masses or laceration. Hair is normal.     Right Ear: External ear normal.     Left Ear: External ear normal.     Nose: Nose normal.     Mouth/Throat:     Lips: Pink.     Mouth: Mucous membranes are moist. No oral lesions.     Pharynx: No pharyngeal swelling, oropharyngeal exudate, posterior oropharyngeal erythema or uvula swelling.     Tonsils: No tonsillar exudate or tonsillar abscesses.  Eyes:     General: Lids are normal.        Right eye: No discharge.        Left eye: No discharge.     Conjunctiva/sclera: Conjunctivae normal.     Right eye: No exudate.    Left eye: No exudate. Abdominal:     General: Abdomen is flat.     Palpations: Abdomen is soft.     Tenderness: There is no abdominal tenderness. There is no rebound.  Genitourinary:    Pubic Area: No rash or pubic lice.      Penis: Normal and circumcised. No erythema or discharge.      Testes:        Right: Mass or tenderness not present.        Left: Mass or tenderness not present.     Rectum: Normal.     Comments: One testicle noted,  history of testicular cancer Musculoskeletal:     Cervical back: Full passive range of motion  without pain, normal range of motion and neck supple.  Lymphadenopathy:     Cervical: No cervical adenopathy.     Right cervical: No superficial, deep or posterior cervical adenopathy.    Left cervical: No superficial, deep or posterior cervical adenopathy.     Upper Body:     Right upper body: No supraclavicular, axillary or epitrochlear adenopathy.     Left upper body: No supraclavicular, axillary or epitrochlear adenopathy.     Lower Body: No right inguinal adenopathy. No left inguinal adenopathy.  Skin:    General: Skin is warm and dry.     Findings: No lesion or rash.  Neurological:     Mental Status: He is alert and oriented to person, place, and time.  Psychiatric:        Attention and Perception: Attention normal.         Mood and Affect: Mood normal.        Speech: Speech normal.        Behavior: Behavior normal. Behavior is cooperative.       Assessment and Plan:  Andre Valdez is a 26 y.o. male presenting to the [facility] Department for STI screening  1. Screening examination for venereal disease -26 year old male in clinic today for STD screening. -Patient does not have STI symptoms Patient accepted all screenings including  urine, CT/GC and bloodwork for HIV/RPR.  Patient meets criteria for HepB screening? Yes. Ordered? Yes Patient meets criteria for HepC screening? Yes. Ordered? Yes Recommended condom use with all sex Discussed importance of condom use for STI prevent  Treat gram stain per standing order Discussed time line for [facility] results and that patient will be called with positive results and encouraged patient to call if he had not heard in 2 weeks Recommended returning for continued or worsening symptoms.     - HIV/HCV [facility] - Syphilis Serology, [facility] - HBV Antigen/Antibody [facility] - Chlamydia/GC NAA, Confirmation   Total time spent: 30 minutes   Return if symptoms worsen or fail to improve.   Gregary Cromer, FNP

## 2022-04-24 LAB — CHLAMYDIA/GC NAA, CONFIRMATION
Chlamydia trachomatis, NAA: POSITIVE — AB
Neisseria gonorrhoeae, NAA: NEGATIVE

## 2022-04-24 LAB — C. TRACHOMATIS NAA, CONFIRM: C. trachomatis NAA, Confirm: POSITIVE — AB

## 2022-04-25 ENCOUNTER — Telehealth: Payer: Self-pay

## 2022-04-25 NOTE — Telephone Encounter (Addendum)
Phone call to pt re positive chlamydia result from 04/17/22 urine specimen.  Pt needs tx appt.  Phone call to pt at [phone] with [employer]. Pt confirmed password.  Pt counseled about positive results. Pt states NKA, is available Tuesday for tx.  Tx appt scheduled for 05/01/22.

## 2022-05-01 NOTE — Telephone Encounter (Signed)
Phone call to pt with [employer] at [phone]. RN stated she was calling pt due to his missed tx appt this morning. Pt states he would like to reschedule tx appt for next Tuesday.  Tx appt rescheduled for 05/08/22.   Also, provided appt line number, [phone].

## 2022-05-08 ENCOUNTER — Ambulatory Visit: Payer: Self-pay

## 2022-05-08 DIAGNOSIS — A749 Chlamydial infection, unspecified: Secondary | ICD-10-CM

## 2022-05-08 MED ORDER — DOXYCYCLINE HYCLATE 100 MG PO TABS: 100.0000 mg | ORAL_TABLET | Freq: Two times a day (BID) | ORAL | 0 refills | Status: AC

## 2022-05-08 NOTE — Progress Notes (Signed)
In nurse clinic for chlamydia treatment. NKA. Denies symptoms.  The patient was dispensed doxycycline 100 mg #14 today per [facility] Dr Lubertha Sayres. Instructions reviewed on how to take med.  I provided counseling today regarding the medication. We discussed the medication, the side effects and when to call clinic. Patient given the opportunity to ask questions. Questions answered.  Verbalizes understanding.   Lang line # [phone] served as interpreter today. Josie Saunders, RN
